# Patient Record
Sex: Female | Born: 1935
Health system: Southern US, Community
[De-identification: ages and names within clinical notes are randomized; demographics above are authoritative.]

## PROBLEM LIST (undated history)

## (undated) DIAGNOSIS — I83813 Varicose veins of bilateral lower extremities with pain: Secondary | ICD-10-CM

## (undated) DIAGNOSIS — C4491 Basal cell carcinoma of skin, unspecified: Secondary | ICD-10-CM

## (undated) DIAGNOSIS — E785 Hyperlipidemia, unspecified: Secondary | ICD-10-CM

## (undated) HISTORY — DX: Varicose veins of bilateral lower extremities with pain: I83.813

## (undated) HISTORY — DX: Hyperlipidemia, unspecified: E78.5

## (undated) HISTORY — PX: ABDOMINAL HYSTERECTOMY: SHX81

---
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
## 2017-03-03 ENCOUNTER — Telehealth: Payer: Self-pay | Admitting: Internal Medicine

## 2017-03-03 NOTE — Telephone Encounter (Signed)
Appointment scheduled.

## 2017-03-03 NOTE — Telephone Encounter (Signed)
Ok Thx 

## 2017-03-03 NOTE — Telephone Encounter (Signed)
Pt is requesting to establish care with you. I spoke with Jessica Rowland and she said that she spoke with you about seeing this pt. Would it be okay for me to set up an appointment for her to see you?

## 2017-03-13 ENCOUNTER — Ambulatory Visit (INDEPENDENT_AMBULATORY_CARE_PROVIDER_SITE_OTHER): Payer: Self-pay | Admitting: Internal Medicine

## 2017-03-13 ENCOUNTER — Encounter: Payer: Self-pay | Admitting: Internal Medicine

## 2017-03-13 DIAGNOSIS — G5701 Lesion of sciatic nerve, right lower limb: Secondary | ICD-10-CM

## 2017-03-13 MED ORDER — B COMPLEX PO TABS: 1.0000 | ORAL_TABLET | Freq: Every day | ORAL | 3 refills | Status: DC

## 2017-03-13 MED ORDER — VITAMIN D3 50 MCG (2000 UT) PO CAPS: 2000.0000 [IU] | ORAL_CAPSULE | Freq: Every day | ORAL | 3 refills | Status: AC

## 2017-03-13 MED ORDER — NAPROXEN SODIUM 220 MG PO TABS: 220.0000 mg | ORAL_TABLET | Freq: Every day | ORAL | 1 refills | Status: DC

## 2017-03-13 NOTE — Progress Notes (Signed)
   Subjective:  Patient ID: Jessica Rowland, female    DOB: 30-Oct-1931  Age: 83 y.o. MRN: [id number]  CC: No chief complaint on file.   HPI Jessica Rowland presents for R hip pain and pain down the leg x 2 years after she fell - worse now. Good ROM. Worse w/standing. Lives w/dtr  No outpatient prescriptions prior to visit.   No facility-administered medications prior to visit.     ROS Review of Systems  Constitutional: Negative for activity change, appetite change, chills, fatigue and unexpected weight change.  HENT: Negative for congestion, mouth sores and sinus pressure.   Eyes: Negative for visual disturbance.  Respiratory: Negative for cough and chest tightness.   Gastrointestinal: Negative for abdominal pain and nausea.  Genitourinary: Negative for difficulty urinating, frequency and vaginal pain.  Musculoskeletal: Positive for arthralgias and gait problem. Negative for back pain.  Skin: Negative for pallor and rash.  Neurological: Negative for dizziness, tremors, weakness, numbness and headaches.  Psychiatric/Behavioral: Negative for confusion and sleep disturbance.    Objective:  BP 116/62 (BP Location: Left Arm, Patient Position: Sitting, Cuff Size: Normal)   Pulse 80   Temp 98.1 F (36.7 C) (Oral)   Ht 5\' 2"  (1.575 m)   Wt 101 lb (45.8 kg)   SpO2 99%   BMI 18.47 kg/m   BP Readings from Last 3 Encounters:  03/13/17 116/62    Wt Readings from Last 3 Encounters:  03/13/17 101 lb (45.8 kg)    Physical Exam  Constitutional: She appears well-developed. No distress.  HENT:  Head: Normocephalic.  Right Ear: External ear normal.  Left Ear: External ear normal.  Nose: Nose normal.  Mouth/Throat: Oropharynx is clear and moist.  Eyes: Pupils are equal, round, and reactive to light. Conjunctivae are normal. Right eye exhibits no discharge. Left eye exhibits no discharge.  Neck: Normal range of motion. Neck supple. No JVD present. No tracheal deviation present. No  thyromegaly present.  Cardiovascular: Normal rate, regular rhythm and normal heart sounds.   Pulmonary/Chest: No stridor. No respiratory distress. She has no wheezes.  Abdominal: Soft. Bowel sounds are normal. She exhibits no distension and no mass. There is no tenderness. There is no rebound and no guarding.  Musculoskeletal: She exhibits tenderness. She exhibits no edema.  Lymphadenopathy:    She has no cervical adenopathy.  Neurological: She displays normal reflexes. No cranial nerve deficit. She exhibits normal muscle tone. Coordination normal.  Skin: No rash noted. No erythema.  Psychiatric: She has a normal mood and affect. Her behavior is normal. Judgment and thought content normal.  Str leg (-) B - very flexible R buttock and IT band is tender B hips w/full ROM, NT Thin   No results found for: WBC, HGB, HCT, PLT, GLUCOSE, CHOL, TRIG, HDL, LDLDIRECT, LDLCALC, ALT, AST, NA, K, CL, CREATININE, BUN, CO2, TSH, PSA, INR, GLUF, HGBA1C, MICROALBUR  Patient was never admitted.  Assessment & Plan:   There are no diagnoses linked to this encounter. Ms. Daley does not currently have medications on file.  No orders of the defined types were placed in this encounter.    Follow-up: No Follow-up on file.  Walker Kehr, MD

## 2017-03-13 NOTE — Patient Instructions (Signed)
Piriformis Syndrome Rehab Ask your health care provider which exercises are safe for you. Do exercises exactly as told by your health care provider and adjust them as directed. It is normal to feel mild stretching, pulling, tightness, or discomfort as you do these exercises, but you should stop right away if you feel sudden pain or your pain gets worse.Do not begin these exercises until told by your health care provider. Stretching and range of motion exercises These exercises warm up your muscles and joints and improve the movement and flexibility of your hip and pelvis. These exercises also help to relieve pain, numbness, and tingling. Exercise A: Hip rotators  1. Lie on your back on a firm surface. 2. Pull your left / right knee toward your same shoulder with your left / right hand until your knee is pointing toward the ceiling. Hold your left / right ankle with your other hand. 3. Keeping your knee steady, gently pull your left / right ankle toward your other shoulder until you feel a stretch in your buttocks. 4. Hold this position for __________ seconds. Repeat __________ times. Complete this stretch __________ times a day. Exercise B: Hip extensors 1. Lie on your back on a firm surface. Both of your legs should be straight. 2. Pull your left / right knee to your chest. Hold your leg in this position by holding onto the back of your thigh or the front of your knee. 3. Hold this position for __________ seconds. 4. Slowly return to the starting position. Repeat __________ times. Complete this stretch __________ times a day. Strengthening exercises These exercises build strength and endurance in your hip and thigh muscles. Endurance is the ability to use your muscles for a long time, even after they get tired. Exercise C: Straight leg raises ( hip abductors) 1. Lie on your side with your left / right leg in the top position. Lie so your head, shoulder, knee, and hip line up. Bend your bottom  knee to help you balance. 2. Lift your top leg up 4-6 inches (10-15 cm), keeping your toes pointed straight ahead. 3. Hold this position for __________ seconds. 4. Slowly lower your leg to the starting position. Let your muscles relax completely. Repeat __________ times. Complete this exercise__________ times a day. Exercise D: Hip abductors and rotators, quadruped  1. Get on your hands and knees on a firm, lightly padded surface. Your hands should be directly below your shoulders, and your knees should be directly below your hips. 2. Lift your left / right knee out to the side. Keep your knee bent. Do not twist your body. 3. Hold this position for __________ seconds. 4. Slowly lower your leg. Repeat __________ times. Complete this exercise__________ times a day. Exercise E: Straight leg raises ( hip extensors) 1. Lie on your abdomen on a bed or a firm surface with a pillow under your hips. 2. Squeeze your buttock muscles and lift your left / right thigh off the bed. Do not let your back arch. 3. Hold this position for __________ seconds. 4. Slowly return to the starting position. Let your muscles relax completely before doing another repetition. Repeat __________ times. Complete this exercise__________ times a day. This information is not intended to replace advice given to you by your health care provider. Make sure you discuss any questions you have with your health care provider. Document Released: 07/29/2005 Document Revised: 04/02/2016 Document Reviewed: 07/11/2015 Elsevier Interactive Patient Education  2018 [employer] Piriformis Syndrome Piriformis syndrome is a   condition that can cause pain and numbness in your buttocks and down the back of your leg. Piriformis syndrome happens when the small muscle that connects the base of your spine to your hip (piriformis muscle) presses on the nerve that runs down the back of your leg (sciatic nerve). The piriformis muscle helps your hip  rotate and helps to bring your leg back and out. It also helps shift your weight while you are walking to keep you stable. The sciatic nerve runs under or through the piriformis. Damage to the piriformis muscle can cause spasms that put pressure on the nerve below. This causes pain and discomfort while sitting and moving. The pain may feel as if it begins in the buttock and spreads (radiates) down your hip and thigh. What are the causes? This condition is caused by pressure on the sciatic nerve from the piriformis muscle. The piriformis muscle can get irritated with overuse, especially if other hip muscles are weak and the piriformis has to do extra work. Piriformis syndrome can also occur after an injury, like a fall onto your buttocks. What increases the risk? This condition is more likely to develop in:  Women.  People who sit for long periods of time.  Cyclists.  People who have weak buttocks muscles (gluteal muscles).  What are the signs or symptoms? Pain, tingling, or numbness that starts in the buttock and runs down the back of your leg (sciatica) is the most common symptom of this condition. Your symptoms may:  Get worse the longer you sit.  Get worse when you walk, run, or go up on stairs.  How is this diagnosed? This condition is diagnosed based on your symptoms, medical history, and physical exam. During this exam, your health care provider may move your leg into different positions to check for pain. He or she will also press on the muscles of your hip and buttock to see if that increases your symptoms. You may also have an X-ray or MRI. How is this treated? Treatment for this condition may include:  Stopping all activities that cause pain or make your condition worse.  Using heat or ice to relieve pain as told by your health care provider.  Taking medicines to reduce pain and swelling.  Taking a muscle relaxer to release the piriformis muscle.  Doing range-of-motion and  strengthening exercises (physical therapy) as told by your health care provider.  Massaging the affected area.  Getting an injection of an anti-inflammatory medicine or muscle relaxer to reduce inflammation and muscle tension.  In rare cases, you may need surgery to cut the muscle and release pressure on the nerve if other treatments do not work. Follow these instructions at home:  Take over-the-counter and prescription medicines only as told by your health care provider.  Do not sit for long periods. Get up and walk around every 20 minutes or as often as told by your health care provider.  If directed, apply heat to the affected area as often as told by your health care provider. Use the heat source that your health care provider recommends, such as a moist heat pack or a heating pad. ? Place a towel between your skin and the heat source. ? Leave the heat on for 20-30 minutes. ? Remove the heat if your skin turns bright red. This is especially important if you are unable to feel pain, heat, or cold. You may have a greater risk of getting burned.  If directed, apply ice to the   injured area. ? Put ice in a plastic bag. ? Place a towel between your skin and the bag. ? Leave the ice on for 20 minutes, 2-3 times a day.  Do exercises as told by your health care provider.  Return to your normal activities as told by your health care provider. Ask your health care provider what activities are safe for you.  Keep all follow-up visits as told by your health care provider. This is important. How is this prevented?  Do not sit for longer than 20 minutes at a time. When you sit, choose padded surfaces.  Warm up and stretch before being active.  Cool down and stretch after being active.  Give your body time to rest between periods of activity.  Make sure to use equipment that fits you.  Maintain physical fitness, including: ? Strength. ? Flexibility. Contact a health care provider  if:  Your pain and stiffness continue or get worse.  Your leg or hip becomes weak.  You have changes in your bowel function or bladder function. This information is not intended to replace advice given to you by your health care provider. Make sure you discuss any questions you have with your health care provider. Document Released: 07/29/2005 Document Revised: 04/02/2016 Document Reviewed: 07/11/2015 Elsevier Interactive Patient Education  2018 [employer]  

## 2017-04-03 ENCOUNTER — Telehealth: Payer: Self-pay | Admitting: Internal Medicine

## 2017-04-03 NOTE — Telephone Encounter (Signed)
Pt friend called asking that before the Pts appointment if Blood work and urinalysis and a chest xray can be ordered, she would like a chest xray because she has pain in her chest and pain when breathing. She would like this done so the results can be discussed at the appointment.  Please  Advise  The Pt now has Medicare

## 2017-04-03 NOTE — Telephone Encounter (Signed)
Come for an OV first Thx

## 2017-04-03 NOTE — Telephone Encounter (Signed)
Friend -Alfia informed of Plot response and will Inform Pt

## 2017-04-03 NOTE — Telephone Encounter (Signed)
Please advise 

## 2017-04-21 ENCOUNTER — Ambulatory Visit: Payer: Self-pay | Admitting: Internal Medicine

## 2017-05-15 ENCOUNTER — Other Ambulatory Visit (INDEPENDENT_AMBULATORY_CARE_PROVIDER_SITE_OTHER): Payer: Medicare Other

## 2017-05-15 ENCOUNTER — Encounter: Payer: Self-pay | Admitting: Internal Medicine

## 2017-05-15 ENCOUNTER — Ambulatory Visit (INDEPENDENT_AMBULATORY_CARE_PROVIDER_SITE_OTHER): Payer: Medicare Other | Admitting: Internal Medicine

## 2017-05-15 ENCOUNTER — Ambulatory Visit: Payer: Self-pay | Admitting: Internal Medicine

## 2017-05-15 VITALS — BP 138/64 | HR 70 | Temp 97.7°F | Ht 62.0 in | Wt 99.0 lb

## 2017-05-15 DIAGNOSIS — R42 Dizziness and giddiness: Secondary | ICD-10-CM | POA: Diagnosis not present

## 2017-05-15 DIAGNOSIS — R202 Paresthesia of skin: Secondary | ICD-10-CM

## 2017-05-15 DIAGNOSIS — Z23 Encounter for immunization: Secondary | ICD-10-CM

## 2017-05-15 DIAGNOSIS — C4490 Unspecified malignant neoplasm of skin, unspecified: Secondary | ICD-10-CM

## 2017-05-15 DIAGNOSIS — E559 Vitamin D deficiency, unspecified: Secondary | ICD-10-CM

## 2017-05-15 DIAGNOSIS — M5441 Lumbago with sciatica, right side: Secondary | ICD-10-CM | POA: Diagnosis not present

## 2017-05-15 DIAGNOSIS — C44300 Unspecified malignant neoplasm of skin of unspecified part of face: Secondary | ICD-10-CM | POA: Insufficient documentation

## 2017-05-15 DIAGNOSIS — G5701 Lesion of sciatic nerve, right lower limb: Secondary | ICD-10-CM | POA: Diagnosis not present

## 2017-05-15 DIAGNOSIS — H547 Unspecified visual loss: Secondary | ICD-10-CM | POA: Insufficient documentation

## 2017-05-15 LAB — BASIC METABOLIC PANEL
BUN: 8 mg/dL (ref 6–23)
CHLORIDE: 99 meq/L (ref 96–112)
CO2: 26 meq/L (ref 19–32)
Calcium: 9.3 mg/dL (ref 8.4–10.5)
Creatinine, Ser: 0.55 mg/dL (ref 0.40–1.20)
GFR: 112.25 mL/min (ref >60.00)
GLUCOSE: 99 mg/dL (ref 70–99)
POTASSIUM: 3.9 meq/L (ref 3.5–5.1)
SODIUM: 132 meq/L — AB (ref 135–145)

## 2017-05-15 LAB — URINALYSIS, ROUTINE W REFLEX MICROSCOPIC
Bilirubin Urine: NEGATIVE
HGB URINE DIPSTICK: NEGATIVE
Ketones, ur: NEGATIVE
NITRITE: NEGATIVE
RBC / HPF: NONE SEEN (ref 0–?)
Specific Gravity, Urine: <=1.005 — AB (ref 1.000–1.030)
TOTAL PROTEIN, URINE-UPE24: NEGATIVE
Urine Glucose: NEGATIVE
Urobilinogen, UA: 0.2 (ref 0.0–1.0)
pH: 7 (ref 5.0–8.0)

## 2017-05-15 LAB — CBC WITH DIFFERENTIAL/PLATELET
BASOS ABS: 0 10*3/uL (ref 0.0–0.1)
Basophils Relative: 0.6 % (ref 0.0–3.0)
EOS PCT: 0.4 % (ref 0.0–5.0)
Eosinophils Absolute: 0 10*3/uL (ref 0.0–0.7)
HEMATOCRIT: 41.2 % (ref 36.0–46.0)
Hemoglobin: 13.8 g/dL (ref 12.0–15.0)
LYMPHS ABS: 1.5 10*3/uL (ref 0.7–4.0)
LYMPHS PCT: 26.7 % (ref 12.0–46.0)
MCHC: 33.5 g/dL (ref 30.0–36.0)
MCV: 91.2 fl (ref 78.0–100.0)
MONOS PCT: 6.9 % (ref 3.0–12.0)
Monocytes Absolute: 0.4 10*3/uL (ref 0.1–1.0)
NEUTROS ABS: 3.8 10*3/uL (ref 1.4–7.7)
NEUTROS PCT: 65.4 % (ref 43.0–77.0)
PLATELETS: 300 10*3/uL (ref 150.0–400.0)
RBC: 4.51 Mil/uL (ref 3.87–5.11)
RDW: 13.7 % (ref 11.5–15.5)
WBC: 5.8 10*3/uL (ref 4.0–10.5)

## 2017-05-15 LAB — VITAMIN B12: Vitamin B-12: 1002 pg/mL — ABNORMAL HIGH (ref 211–911)

## 2017-05-15 LAB — HEPATIC FUNCTION PANEL
ALBUMIN: 4.2 g/dL (ref 3.5–5.2)
ALK PHOS: 47 U/L (ref 39–117)
ALT: 15 U/L (ref 0–35)
AST: 14 U/L (ref 0–37)
Bilirubin, Direct: 0.1 mg/dL (ref 0.0–0.3)
TOTAL PROTEIN: 7 g/dL (ref 6.0–8.3)
Total Bilirubin: 0.7 mg/dL (ref 0.2–1.2)

## 2017-05-15 LAB — TSH: TSH: 0.62 u[IU]/mL (ref 0.35–4.50)

## 2017-05-15 NOTE — Assessment & Plan Note (Signed)
ophth ref adviced

## 2017-05-15 NOTE — Assessment & Plan Note (Signed)
PT Aleve

## 2017-05-15 NOTE — Assessment & Plan Note (Signed)
[facility]

## 2017-05-15 NOTE — Assessment & Plan Note (Signed)
L cheek Skin surgery ref

## 2017-05-15 NOTE — Progress Notes (Signed)
Subjective:  Patient ID: Jessica Rowland, female    DOB: May 12, 1932  Age: 83 y.o. MRN: [id number]  CC: No chief complaint on file.   HPI Jessica Rowland presents for her R leg/buttock pain - 70% better C/o occ dizziness, eye site is down  Outpatient Medications Prior to Visit  Medication Sig Dispense Refill  . b complex vitamins tablet Take 1 tablet by mouth daily. 100 tablet 3  . Cholecalciferol (VITAMIN D3) 2000 units capsule Take 1 capsule (2,000 Units total) by mouth daily. 100 capsule 3  . Ginkgo Biloba 120 MG CAPS Take 120 mg by mouth 3 (three) times daily.    . naproxen sodium (ALEVE) 220 MG tablet Take 1 tablet (220 mg total) by mouth daily after breakfast. 30 tablet 1  . Omega-3 Fatty Acids (FISH OIL) 1000 MG CAPS Take 1,000 mg by mouth 2 (two) times daily.    . Vinpocetine POWD 30 mg by Does not apply route 3 (three) times daily.     No facility-administered medications prior to visit.     ROS Review of Systems  Constitutional: Negative for activity change, appetite change, chills, fatigue and unexpected weight change.  HENT: Negative for congestion, mouth sores and sinus pressure.   Eyes: Negative for visual disturbance.  Respiratory: Negative for cough and chest tightness.   Gastrointestinal: Negative for abdominal pain and nausea.  Genitourinary: Negative for difficulty urinating, frequency and vaginal pain.  Musculoskeletal: Positive for back pain and gait problem.  Skin: Negative for pallor and rash.  Neurological: Negative for dizziness, tremors, weakness, numbness and headaches.  Psychiatric/Behavioral: Negative for confusion and sleep disturbance.    Objective:  BP 138/64 (BP Location: Left Arm, Patient Position: Sitting, Cuff Size: Normal)   Pulse 70   Temp 97.7 F (36.5 C) (Oral)   Ht 5\' 2"  (1.575 m)   Wt 99 lb (44.9 kg)   SpO2 99%   BMI 18.11 kg/m   BP Readings from Last 3 Encounters:  05/15/17 138/64  03/13/17 116/62    Wt Readings from  Last 3 Encounters:  05/15/17 99 lb (44.9 kg)  03/13/17 101 lb (45.8 kg)    Physical Exam  Constitutional: She appears well-developed. No distress.  HENT:  Head: Normocephalic.  Right Ear: External ear normal.  Left Ear: External ear normal.  Nose: Nose normal.  Mouth/Throat: Oropharynx is clear and moist.  Eyes: Pupils are equal, round, and reactive to light. Conjunctivae are normal. Right eye exhibits no discharge. Left eye exhibits no discharge.  Neck: Normal range of motion. Neck supple. No JVD present. No tracheal deviation present. No thyromegaly present.  Cardiovascular: Normal rate, regular rhythm and normal heart sounds.   Pulmonary/Chest: No stridor. No respiratory distress. She has no wheezes.  Abdominal: Soft. Bowel sounds are normal. She exhibits no distension and no mass. There is no tenderness. There is no rebound and no guarding.  Musculoskeletal: She exhibits no edema or tenderness.  Lymphadenopathy:    She has no cervical adenopathy.  Neurological: She displays normal reflexes. No cranial nerve deficit. She exhibits normal muscle tone. Coordination abnormal.  Skin: No rash noted. No erythema.  Psychiatric: She has a normal mood and affect. Her behavior is normal. Judgment and thought content normal.  L cheek lesion, L jaw angle LN 1.0x0.5 cm  No results found for: WBC, HGB, HCT, PLT, GLUCOSE, CHOL, TRIG, HDL, LDLDIRECT, LDLCALC, ALT, AST, NA, K, CL, CREATININE, BUN, CO2, TSH, PSA, INR, GLUF, HGBA1C, MICROALBUR  Patient was never admitted.  Assessment & Plan:   There are no diagnoses linked to this encounter. I am having Ms. Gillingham maintain her Fish Oil, Ginkgo Biloba, Vinpocetine, Vitamin D3, b complex vitamins, naproxen sodium, and aspirin EC.  Meds ordered this encounter  Medications  . aspirin EC 81 MG tablet    Sig: Take 81 mg by mouth daily.     Follow-up: No Follow-up on file.  Walker Kehr, MD

## 2017-05-21 NOTE — Addendum Note (Signed)
Addended by: Karren Cobble on: 05/21/2017 04:18 PM   Modules accepted: Orders

## 2017-06-12 ENCOUNTER — Ambulatory Visit: Payer: Medicare Other | Attending: Internal Medicine | Admitting: Physical Therapy

## 2017-06-12 DIAGNOSIS — M25551 Pain in right hip: Secondary | ICD-10-CM | POA: Diagnosis not present

## 2017-06-12 DIAGNOSIS — M6281 Muscle weakness (generalized): Secondary | ICD-10-CM | POA: Diagnosis not present

## 2017-06-12 NOTE — Patient Instructions (Signed)
     Trigger Point Dry Needling  . What is Trigger Point Dry Needling (DN)? o DN is a physical therapy technique used to treat muscle pain and dysfunction. Specifically, DN helps deactivate muscle trigger points (muscle knots).  o A thin filiform needle is used to penetrate the skin and stimulate the underlying trigger point. The goal is for a local twitch response (LTR) to occur and for the trigger point to relax. No medication of any kind is injected during the procedure.   . What Does Trigger Point Dry Needling Feel Like?  o The procedure feels different for each individual patient. Some patients report that they do not actually feel the needle enter the skin and overall the process is not painful. Very mild bleeding may occur. However, many patients feel a deep cramping in the muscle in which the needle was inserted. This is the local twitch response.   . How Will I feel after the treatment? o Soreness is normal, and the onset of soreness may not occur for a few hours. Typically this soreness does not last longer than two days.  o Bruising is uncommon, however; ice can be used to decrease any possible bruising.  o In rare cases feeling tired or nauseous after the treatment is normal. In addition, your symptoms may get worse before they get better, this period will typically not last longer than 24 hours.   . What Can I do After My Treatment? o Increase your hydration by drinking more water for the next 24 hours. o You may place ice or heat on the areas treated that have become sore, however, do not use heat on inflamed or bruised areas. Heat often brings more relief post needling. o You can continue your regular activities, but vigorous activity is not recommended initially after the treatment for 24 hours. o DN is best combined with other physical therapy such as strengthening, stretching, and other therapies.    Thelda Gagan PT [facility] [address], [address] [place], [place] [address] Phone # [phone] Fax [phone] 

## 2017-06-12 NOTE — Therapy (Signed)
[facility] [address], [place], [place], [address] Phone: [phone]   Fax:  [phone]  Physical Therapy Evaluation  Patient Details  Name: Jessica Rowland MRN: [id number] Date of Birth: [birth year] Referring Provider: Dr. Alain Marion  Encounter Date: 06/12/2017      PT End of Session - 06/12/17 1520    Visit Number 1   Date for PT Re-Evaluation 08/07/17   PT Start Time 1440   PT Stop Time 1525   PT Time Calculation (min) 45 min   Activity Tolerance Patient tolerated treatment well      Past Medical History:  Diagnosis Date  . Hyperlipidemia     Past Surgical History:  Procedure Laterality Date  . ABDOMINAL HYSTERECTOMY      There were no vitals filed for this visit.       Subjective Assessment - 06/12/17 1445    Subjective Long time history 10 years ago;  of right hip to pain to bottom of foot;  numbness in foot;  constant symptoms;  exercises improved pain but numbness same.  PCP gave her stretches.     Patient is accompained by: Interpreter   Pertinent History hard of hearing    Limitations House hold activities;Walking;Standing;Sitting   How long can you sit comfortably? 5-10 min    How long can you walk comfortably? 15 min   Diagnostic tests no x-rays or scans   Patient Stated Goals she hopes she will not have pain and reduce numbness   Currently in Pain? Yes   Pain Score 5    Pain Location Hip   Pain Orientation Right   Pain Type Chronic pain   Pain Radiating Towards right LE to foot   Pain Onset More than a month ago   Pain Frequency Constant   Aggravating Factors  sitting; bending over, walking   Pain Relieving Factors piriformis stretching; heat;   massage with ball            [facility] PT Assessment - 06/12/17 0001      Assessment   Medical Diagnosis right piriformis syndrome   Referring Provider Dr. Alain Marion   Onset Date/Surgical Date --  10 years   Next MD Visit Jan 2019    Prior Therapy no     Precautions   Precautions None     Restrictions   Weight Bearing Restrictions No     Balance Screen   Has the patient fallen in the past 6 months No   Has the patient had a decrease in activity level because of a fear of falling?  No   Is the patient reluctant to leave their home because of a fear of falling?  No     Home Environment   Living Environment Private residence   Living Arrangements Alone   Available Help at Discharge Family   Type of [place] to enter   Entrance Stairs-Number of Steps 12  difficult to go up stairs   Home Layout One level   Home Equipment None     Prior Function   Level of Independence Independent with basic ADLs   Vocation Retired   Leisure reading     Observation/Other Assessments   Focus on Therapeutic Outcomes (FOTO)  non English speaking     Sensation   Light Touch --  decreased sensation to light touch L5/S1 dermatome     Posture/Postural Control   Posture Comments decreased lumbar lordosis, increased thoracic kyphosis  AROM   AROM Assessment Site --  lumbar and hip ROM WFLs;  pain with hip internal rotation   Right/Left Hip Right;Left     Strength   Strength Assessment Site --  Right ankle DF, PF, Inversion and eversion 4-/5   Right/Left Hip Right;Left   Right Hip Flexion 4-/5   Right Hip Extension 4-/5   Right Hip External Rotation  4-/5   Right Hip ABduction 3+/5   Lumbar Flexion --  decreased activation of transverse abdominus     Flexibility   Soft Tissue Assessment /Muscle Length --  able to touch toes in long sitting position     Palpation   Palpation comment tender points in right piriformis      Special Tests    Special Tests --  SLS on right 3 sec with UE support     Slump test   Findings Positive   Side Right     Straight Leg Raise   Findings Positive   Side  Right            Objective measurements completed on examination: See above  findings.          [facility] Adult PT Treatment/Exercise - 06/12/17 0001      Moist Heat Therapy   Number Minutes Moist Heat 5 Minutes   Moist Heat Location Hip     Manual Therapy   Soft tissue mobilization right piriformis          Trigger Point Dry Needling - 06/12/17 1653    Consent Given? Yes   Education Handout Provided Yes   Muscles Treated Lower Body Piriformis   Piriformis Response Palpable increased muscle length              PT Education - 06/12/17 1653    Education provided Yes   Education Details dry needling info   Person(s) Educated Patient   Methods Explanation;Handout   Comprehension Verbalized understanding          PT Short Term Goals - 06/12/17 1704      PT SHORT TERM GOAL #1   Title The patient will demonstrate knowledge of basic self care strategies for pain reduction   Time 4   Period Weeks   Status New   Target Date 07/10/17     PT SHORT TERM GOAL #2   Title The patient will report a 30% improvement in pain with sitting, walking and standing   Time 4   Period Weeks   Status New     PT SHORT TERM GOAL #3   Title The patient will be able to sit 10-12 minutes with minimal increase in pain   Time 4   Period Weeks   Status New           PT Long Term Goals - 06/12/17 1706      PT LONG TERM GOAL #1   Title The patient will be independent in safe self progression of HEP   Time 8   Period Weeks   Status New   Target Date 08/07/17     PT LONG TERM GOAL #2   Title The patient will report a 60% improvement in pain with sitting, standing and walking   Time 8   Period Weeks   Status New     PT LONG TERM GOAL #3   Title The patient will have improved right LE strength grossly 4/5 needed for standing, walking and ascending/descending steps to her apartment   Time 8  Period Weeks   Status New     PT LONG TERM GOAL #4   Title The patient will be able to sit for 15 minutes with minimal increase in pain   Time 8   Period  Weeks   Status New                Plan - 06/12/17 1654    Clinical Impression Statement Long time history 10 years ago.  The pain was initially in the  right buttock but has progressed  to radiating pain to bottom of her foot.  More recently she now has numbness in foot and lack of sensation on a constant basis.   She has started exercises to stretch which have improved pain but the numbness is the same.   Tender points in right piriformis.  Pain with right SLR and slump tests.  Pain with right hip and knee ROM.  Decreased strength in right foot, knee and hip musculature and pain with resistance, grossly 4-/5 except hip abduction 3+/5.  Pain with hip internal rotation but better with external rotation and long axis distraction.  Normal HS length.  She would benefit from PT to address these deficits.     History and Personal Factors relevant to plan of care: non-English speaking; advanced age;  lack of home support;  worsening of symptoms    Clinical Presentation Evolving   Clinical Presentation due to: increased numbness and peripheralization   Clinical Decision Making Moderate   Rehab Potential Good   PT Frequency 2x / week   PT Duration 8 weeks   PT Treatment/Interventions ADLs/Self Care Home Management;Electrical Stimulation;Cryotherapy;Iontophoresis 4mg /ml Dexamethasone;Moist Heat;Ultrasound;Patient/family education;Neuromuscular re-education;Therapeutic exercise;Therapeutic activities;Manual techniques;Taping;Dry needling   PT Next Visit Plan assess response to dry needling;  add iontophoresis if certification signed;  other pain relieving modalities;  manual techniques;  long axis distraction; gluteus medius strengthening     By signing I understand that I am ordering/authorizing the use of Iontophoresis using 4 mg/mL of dexamethasone as a component of this plan of care.  Patient will benefit from skilled therapeutic intervention in order to improve the following deficits and  impairments:  Pain, Decreased activity tolerance, Decreased strength, Difficulty walking, Increased muscle spasms, Impaired sensation  Visit Diagnosis: Pain in right hip - Plan: PT plan of care cert/re-cert  Muscle weakness (generalized) - Plan: PT plan of care cert/re-cert      G-Codes - 05/28/50 1708    Functional Assessment Tool Used (Outpatient Only) Clinical judgement   Functional Limitation Mobility: Walking and moving around   Mobility: Walking and Moving Around Current Status ([id number]) At least 40 percent but less than 60 percent impaired, limited or restricted   Mobility: Walking and Moving Around Goal Status [phone]) At least 20 percent but less than 40 percent impaired, limited or restricted       Problem List Patient Active Problem List   Diagnosis Date Noted  . Skin cancer of face 05/15/2017  . Dizzinesses 05/15/2017  . Vision problems 05/15/2017  . Piriformis syndrome of right side 03/13/2017   Ruben Im, PT 06/12/17 5:11 PM Phone: [phone] Fax: [phone]  Alvera Singh 06/12/2017, 5:10 PM  [facility] [address] [place], [place], [address] Phone: [phone]   Fax:  [phone]  Name: Arelys Glassco MRN: [id number] Date of Birth: 30-May-1932

## 2017-06-17 ENCOUNTER — Ambulatory Visit: Payer: Medicare Other | Admitting: Physical Therapy

## 2017-06-17 DIAGNOSIS — M6281 Muscle weakness (generalized): Secondary | ICD-10-CM | POA: Diagnosis not present

## 2017-06-17 DIAGNOSIS — M25551 Pain in right hip: Secondary | ICD-10-CM | POA: Diagnosis not present

## 2017-06-17 NOTE — Therapy (Signed)
[facility] [address] [place], [place], [address] Phone: [phone]   Fax:  [phone]  Physical Therapy Treatment  Patient Details  Name: Jessica Rowland MRN: [id number] Date of Birth: Dec 18, 1931 Referring Provider: Dr. Alain Marion   Encounter Date: 06/17/2017  PT End of Session - 06/17/17 1316    Visit Number  2    Number of Visits  10    Date for PT Re-Evaluation  08/07/17    Authorization Type  G code at visit 10;  KX at visit 15    PT Start Time  1145    PT Stop Time  1235    PT Time Calculation (min)  50 min    Activity Tolerance  Patient tolerated treatment well       Past Medical History:  Diagnosis Date  . Hyperlipidemia     Past Surgical History:  Procedure Laterality Date  . ABDOMINAL HYSTERECTOMY      There were no vitals filed for this visit.  Subjective Assessment - 06/17/17 1147    Subjective  Reports she is good.  States she had only a little soreness that day following the dry needling.   Numbness continues to be constant.      Patient is accompained by:  Interpreter "Inna" for language resources   "Inna" for language resources   Pertinent History  hard of hearing ;  speaks [place]    Limitations  House hold activities;Walking;Standing;Sitting    Currently in Pain?  Yes    Pain Score  5     Pain Location  Hip    Pain Orientation  Right    Pain Radiating Towards  right LE to foot    Aggravating Factors   lying on that side                      [facility] Adult PT Treatment/Exercise - 06/17/17 0001      Neuro Re-ed    Neuro Re-ed Details   gluteal activation especially medius;  decompression and elongation       Knee/Hip Exercises: Supine   Other Supine Knee/Hip Exercises  leg lengthener 5x    Other Supine Knee/Hip Exercises  whole leg press down 5x      Knee/Hip Exercises: Sidelying   Clams  10x with mild manual resistance      Moist Heat Therapy   Number  Minutes Moist Heat  15 Minutes    Moist Heat Location  Hip      Electrical Stimulation   Electrical Stimulation Location  right hip, lateral thigh and lateral leg    Electrical Stimulation Action  Pre-mod    Electrical Stimulation Parameters  9 ma 15 min    Electrical Stimulation Goals  Pain      Manual Therapy   Manual Therapy  Joint mobilization    Joint Mobilization  long leg distraction 5x 20 sec grade 3    Soft tissue mobilization  right piriformis and gluteals       Trigger Point Dry Needling - 06/17/17 1316    Consent Given?  Yes    Muscles Treated Lower Body  Gluteus minimus;Gluteus maximus;Piriformis    Gluteus Maximus Response  Palpable increased muscle length    Gluteus Minimus Response  Palpable increased muscle length    Piriformis Response  Palpable increased muscle length       Right only      PT Short Term Goals - 06/12/17 1704  PT SHORT TERM GOAL #1   Title  The patient will demonstrate knowledge of basic self care strategies for pain reduction    Time  4    Period  Weeks    Status  New    Target Date  07/10/17      PT SHORT TERM GOAL #2   Title  The patient will report a 30% improvement in pain with sitting, walking and standing    Time  4    Period  Weeks    Status  New      PT SHORT TERM GOAL #3   Title  The patient will be able to sit 10-12 minutes with minimal increase in pain    Time  4    Period  Weeks    Status  New        PT Long Term Goals - 06/12/17 1706      PT LONG TERM GOAL #1   Title  The patient will be independent in safe self progression of HEP    Time  8    Period  Weeks    Status  New    Target Date  08/07/17      PT LONG TERM GOAL #2   Title  The patient will report a 60% improvement in pain with sitting, standing and walking    Time  8    Period  Weeks    Status  New      PT LONG TERM GOAL #3   Title  The patient will have improved right LE strength grossly 4/5 needed for standing, walking and  ascending/descending steps to her apartment    Time  8    Period  Weeks    Status  New      PT LONG TERM GOAL #4   Title  The patient will be able to sit for 15 minutes with minimal increase in pain    Time  8    Period  Weeks    Status  New            Plan - 06/17/17 1318    Clinical Impression Statement  The patient reports her right buttock is feeling better but the right LE numbness continues to be constant.  The patient has decreased piriformis tender points compared to last session but several tender points noted in gluteals.  Mild discomfort with gluteal strengthening exercises but relief with elongation/stretching.      Rehab Potential  Good    PT Frequency  2x / week    PT Duration  8 weeks    PT Treatment/Interventions  ADLs/Self Care Home Management;Electrical Stimulation;Cryotherapy;Iontophoresis 4mg /ml Dexamethasone;Moist Heat;Ultrasound;Patient/family education;Neuromuscular re-education;Therapeutic exercise;Therapeutic activities;Manual techniques;Taping;Dry needling    PT Next Visit Plan  assess response to dry needling;  add iontophoresis if certification signed;  assess response to electrical stimulation;  manual techniques;  long axis distraction; gluteus medius strengthening    Recommended Other Services  certification rerouted 11/6       Patient will benefit from skilled therapeutic intervention in order to improve the following deficits and impairments:  Pain, Decreased activity tolerance, Decreased strength, Difficulty walking, Increased muscle spasms, Impaired sensation  Visit Diagnosis: Pain in right hip  Muscle weakness (generalized)     Problem List Patient Active Problem List   Diagnosis Date Noted  . Skin cancer of face 05/15/2017  . Dizzinesses 05/15/2017  . Vision problems 05/15/2017  . Piriformis syndrome of right side 03/13/2017   Ruben Im,  PT 06/17/17 1:25 PM Phone: [phone] Fax: [phone]  Alvera Singh 06/17/2017,  1:24 PM  [facility] [address] [place], [place], [address] Phone: [phone]   Fax:  [phone]  Name: Jessica Rowland MRN: [id number] Date of Birth: 08/09/1932

## 2017-06-18 ENCOUNTER — Other Ambulatory Visit: Payer: Self-pay

## 2017-06-18 DIAGNOSIS — H547 Unspecified visual loss: Secondary | ICD-10-CM

## 2017-06-19 ENCOUNTER — Ambulatory Visit: Payer: Medicare Other | Admitting: Physical Therapy

## 2017-06-19 DIAGNOSIS — M25551 Pain in right hip: Secondary | ICD-10-CM

## 2017-06-19 DIAGNOSIS — M6281 Muscle weakness (generalized): Secondary | ICD-10-CM

## 2017-06-19 NOTE — Patient Instructions (Signed)
   SIDELYING CLAMSHELL  While lying on your side with your knees bent, draw up the top knee while keeping contact of your feet together.  Do not let your pelvis roll back during the lifting movement. 2 sets of 10   BRIDGING  While lying on your back with knees bent, tighten your lower abdominals, squeeze your buttocks and then raise your buttocks off the floor/bed as creating a "Bridge" with your body. Hold and then lower yourself and repeat. 2 sets of 10

## 2017-06-19 NOTE — Therapy (Signed)
[facility] [address], [place], [place], [address] Phone: [phone]   Fax:  [phone]  Physical Therapy Treatment  Patient Details  Name: Jessica Rowland MRN: [id number] Date of Birth: [birth year] Referring Provider: Dr. Alain Marion   Encounter Date: 06/19/2017  PT End of Session - 06/19/17 1315    Visit Number  3    Number of Visits  10    Date for PT Re-Evaluation  08/07/17    Authorization Type  G code at visit 10;  KX at visit 15    PT Start Time  1232    PT Stop Time  1315    PT Time Calculation (min)  43 min    Activity Tolerance  Patient tolerated treatment well       Past Medical History:  Diagnosis Date  . Hyperlipidemia     Past Surgical History:  Procedure Laterality Date  . ABDOMINAL HYSTERECTOMY      There were no vitals filed for this visit.  Subjective Assessment - 06/19/17 1236    Subjective  I had a new pain that I think was from the electricity last time.  It was the worst at night.    Patient is accompained by:  Interpreter    Pertinent History  hard of hearing ;  speaks [place]    Patient Stated Goals  she hopes she will not have pain and reduce numbness    Currently in Pain?  Yes    Pain Score  6     Pain Location  Hip    Pain Orientation  Right    Pain Descriptors / Indicators  Sharp;Stabbing;Throbbing    Pain Type  Chronic pain    Pain Radiating Towards  mid thight    Pain Onset  More than a month ago    Pain Frequency  Constant    Aggravating Factors   lying down                      [facility] Adult PT Treatment/Exercise - 06/19/17 0001      Neuro Re-ed    Neuro Re-ed Details   gluteal activation especially medius;  decompression and elongation       Knee/Hip Exercises: Supine   Bridges  Strengthening;10 reps;Both    Straight Leg Raises  Strengthening;Right;Left    Other Supine Knee/Hip Exercises  clam with red band - single leg - 10x each side    Other  Supine Knee/Hip Exercises  whole leg press down and leg lengthener - 5x each      Manual Therapy   Manual Therapy  Soft tissue mobilization;Muscle Energy Technique    Soft tissue mobilization  right glutes and TFL    Muscle Energy Technique  posterior rotation of right pelvis             PT Education - 06/19/17 1315    Education provided  Yes    Education Details  bridge and clam    Person(s) Educated  Patient;Other (comment) interpreter    Methods  Explanation;Demonstration;Verbal cues;Handout    Comprehension  Verbalized understanding;Returned demonstration       PT Short Term Goals - 06/19/17 1322      PT SHORT TERM GOAL #1   Title  The patient will demonstrate knowledge of basic self care strategies for pain reduction    Time  4    Period  Weeks    Status  On-going  PT SHORT TERM GOAL #2   Title  The patient will report a 30% improvement in pain with sitting, walking and standing    Time  4    Period  Weeks    Status  On-going      PT SHORT TERM GOAL #3   Title  The patient will be able to sit 10-12 minutes with minimal increase in pain    Time  4    Period  Weeks    Status  On-going        PT Long Term Goals - 06/12/17 1706      PT LONG TERM GOAL #1   Title  The patient will be independent in safe self progression of HEP    Time  8    Period  Weeks    Status  New    Target Date  08/07/17      PT LONG TERM GOAL #2   Title  The patient will report a 60% improvement in pain with sitting, standing and walking    Time  8    Period  Weeks    Status  New      PT LONG TERM GOAL #3   Title  The patient will have improved right LE strength grossly 4/5 needed for standing, walking and ascending/descending steps to her apartment    Time  8    Period  Weeks    Status  New      PT LONG TERM GOAL #4   Title  The patient will be able to sit for 15 minutes with minimal increase in pain    Time  8    Period  Weeks    Status  New            Plan  - 06/19/17 1315    Clinical Impression Statement  Patient did well with exercises and no increased pain.  She reported increased numbness into right leg during MET but then had less numbness after and had improvement of pelvic obliquity with right hip less anterior rotation.  Pt was fatigued with clam but able to perform correctly.  She had a lot of tenderness with palpation around greater trochanter in the gluteal bursea region.  Pt continues to benefit from skilled PT to improve functional movement.      PT Treatment/Interventions  ADLs/Self Care Home Management;Electrical Stimulation;Cryotherapy;Iontophoresis 7m/ml Dexamethasone;Moist Heat;Ultrasound;Patient/family education;Neuromuscular re-education;Therapeutic exercise;Therapeutic activities;Manual techniques;Taping;Dry needling    PT Next Visit Plan  f/u on clam and bridge, dry needle right glutes, ionto if cert signed; check pelvic alignemnt, glute and core strengthening    Consulted and Agree with Plan of Care  Patient       Patient will benefit from skilled therapeutic intervention in order to improve the following deficits and impairments:  Pain, Decreased activity tolerance, Decreased strength, Difficulty walking, Increased muscle spasms, Impaired sensation  Visit Diagnosis: Pain in right hip  Muscle weakness (generalized)     Problem List Patient Active Problem List   Diagnosis Date Noted  . Skin cancer of face 05/15/2017  . Dizzinesses 05/15/2017  . Vision problems 05/15/2017  . Piriformis syndrome of right side 03/13/2017    JZannie Cove PT 06/19/2017, 1:24 PM  [facility] [address] [place] [address]Phone: [phone]  Fax:  [phone] Name: GKaaren NassMRN: [id number] of Birth: 130-Dec-1933

## 2017-06-24 ENCOUNTER — Ambulatory Visit: Payer: Medicare Other | Admitting: Physical Therapy

## 2017-06-24 DIAGNOSIS — M25551 Pain in right hip: Secondary | ICD-10-CM

## 2017-06-24 DIAGNOSIS — M6281 Muscle weakness (generalized): Secondary | ICD-10-CM

## 2017-06-24 NOTE — Therapy (Signed)
[facility] [address] [place], [place], [address] Phone: [phone]   Fax:  [phone]  Physical Therapy Treatment  Patient Details  Name: Jessica Rowland MRN: [id number] Date of Birth: 10/14/1931 Referring Provider: Dr. Alain Marion   Encounter Date: 06/24/2017  PT End of Session - 06/24/17 1023    Visit Number  3    Number of Visits  10    Date for PT Re-Evaluation  08/07/17    Authorization Type  G code at visit 10;  KX at visit 15    PT Start Time  1018    PT Stop Time  1100    PT Time Calculation (min)  42 min    Activity Tolerance  Patient tolerated treatment well       Past Medical History:  Diagnosis Date  . Hyperlipidemia     Past Surgical History:  Procedure Laterality Date  . ABDOMINAL HYSTERECTOMY      There were no vitals filed for this visit.  Subjective Assessment - 06/24/17 1019    Subjective  Pt reports that things are going fair. She has no pain currently, just the numbness down the side of the Rt leg down to her toes.     Patient is accompained by:  Interpreter    Pertinent History  hard of hearing ;  speaks [place]    Patient Stated Goals  she hopes she will not have pain and reduce numbness    Currently in Pain?  No/denies    Pain Onset  More than a month ago                      [facility] Adult PT Treatment/Exercise - 06/24/17 0001      Exercises   Exercises  Knee/Hip      Knee/Hip Exercises: Stretches   Passive Hamstring Stretch  2 reps;30 seconds    Passive Hamstring Stretch Limitations  long sitting, therapist providing instruction of proper mechanics and avoiding excessive trunk flexion with home rountine       Knee/Hip Exercises: Supine   Bridges  Both;1 set;15 reps    Other Supine Knee/Hip Exercises  single leg clam x15 reps, with abdominal bracing     Other Supine Knee/Hip Exercises  hooklying low trunk rotation x10 reps each direction; abdominal  bracing with bent knee raise x15 reps       Manual Therapy   Joint Mobilization  long leg distraction 5x 20 sec grade 3    Soft tissue mobilization  Rt TFL, glutes, lateral quadriceps             PT Education - 06/24/17 1021    Education provided  Yes    Education Details  educated pt on benefits of exercise and stretching to improve pain, but reminded pt on the differences between nerve healing and muscle recovery; reviewed pt's "favorite stretches" from her home routine and made adjustments to decrease strain on lumbar spine.     Person(s) Educated  Patient    Methods  Explanation;Verbal cues;Tactile cues via language intertrepter service    Comprehension  Verbalized understanding       PT Short Term Goals - 06/19/17 1322      PT SHORT TERM GOAL #1   Title  The patient will demonstrate knowledge of basic self care strategies for pain reduction    Time  4    Period  Weeks    Status  On-going  PT SHORT TERM GOAL #2   Title  The patient will report a 30% improvement in pain with sitting, walking and standing    Time  4    Period  Weeks    Status  On-going      PT SHORT TERM GOAL #3   Title  The patient will be able to sit 10-12 minutes with minimal increase in pain    Time  4    Period  Weeks    Status  On-going        PT Long Term Goals - 06/12/17 1706      PT LONG TERM GOAL #1   Title  The patient will be independent in safe self progression of HEP    Time  8    Period  Weeks    Status  New    Target Date  08/07/17      PT LONG TERM GOAL #2   Title  The patient will report a 60% improvement in pain with sitting, standing and walking    Time  8    Period  Weeks    Status  New      PT LONG TERM GOAL #3   Title  The patient will have improved right LE strength grossly 4/5 needed for standing, walking and ascending/descending steps to her apartment    Time  8    Period  Weeks    Status  New      PT LONG TERM GOAL #4   Title  The patient will be  able to sit for 15 minutes with minimal increase in pain    Time  8    Period  Weeks    Status  New            Plan - 06/24/17 1218    Clinical Impression Statement  Pt arrived with continued reports of numbness along the Rt lateral thigh and down to the last 4 toes. She appears to be demonstrating improvements in pain, reporting no pain currently or during her session. Completed therex and manual techniques to improve core strength. Pt demonstrated several exercises from her gymnastics stretching routine that are likely placing excess strain on her low back. Therapist was able to make adjustments to pt's form and pt demonstrated good understanding. Noted significant muscle restrictions throughout the gluteal and lateral quadriceps region, pt could benefit from another dry needling treatment next session to address this.     PT Treatment/Interventions  ADLs/Self Care Home Management;Electrical Stimulation;Cryotherapy;Iontophoresis 4mg /ml Dexamethasone;Moist Heat;Ultrasound;Patient/family education;Neuromuscular re-education;Therapeutic exercise;Therapeutic activities;Manual techniques;Taping;Dry needling    PT Next Visit Plan  f/u on clam and bridge, dry needle right glutes, ionto if cert signed; check pelvic alignemnt, glute and core strengthening    Consulted and Agree with Plan of Care  Patient       Patient will benefit from skilled therapeutic intervention in order to improve the following deficits and impairments:  Pain, Decreased activity tolerance, Decreased strength, Difficulty walking, Increased muscle spasms, Impaired sensation  Visit Diagnosis: Pain in right hip  Muscle weakness (generalized)     Problem List Patient Active Problem List   Diagnosis Date Noted  . Skin cancer of face 05/15/2017  . Dizzinesses 05/15/2017  . Vision problems 05/15/2017  . Piriformis syndrome of right side 03/13/2017    1:23 PM,06/24/17 Elly Modena PT, DPT [facility] [address] [place],  [place], [address] Phone: [phone]   Fax:  [phone]  Name: Jessica Rowland MRN: [id number] Date of Birth: 03/27/1932

## 2017-06-26 ENCOUNTER — Ambulatory Visit: Payer: Medicare Other | Admitting: Physical Therapy

## 2017-06-26 DIAGNOSIS — M25551 Pain in right hip: Secondary | ICD-10-CM | POA: Diagnosis not present

## 2017-06-26 DIAGNOSIS — M6281 Muscle weakness (generalized): Secondary | ICD-10-CM | POA: Diagnosis not present

## 2017-06-26 NOTE — Therapy (Signed)
[facility] [address] [place], [place], [address] Phone: [phone]   Fax:  [phone]  Physical Therapy Treatment  Patient Details  Name: Jessica Rowland MRN: [id number] Date of Birth: [birth year] Referring Provider: Dr. Alain Marion   Encounter Date: 06/26/2017  PT End of Session - 06/26/17 1215    Visit Number  5    Number of Visits  10    Date for PT Re-Evaluation  08/07/17    Authorization Type  G code at visit 10;  KX at visit 15    PT Start Time  1105    PT Stop Time  1146    PT Time Calculation (min)  41 min    Activity Tolerance  Patient tolerated treatment well       Past Medical History:  Diagnosis Date  . Hyperlipidemia     Past Surgical History:  Procedure Laterality Date  . ABDOMINAL HYSTERECTOMY      There were no vitals filed for this visit.  Subjective Assessment - 06/26/17 1112    Subjective  Patient states her leg is numb today.  Denies pain.  She states she cannot sleep on the Rt side    Patient is accompained by:  Interpreter    Pertinent History  hard of hearing ;  speaks [place]    Limitations  House hold activities;Walking;Standing;Sitting    Patient Stated Goals  she hopes she will not have pain and reduce numbness    Currently in Pain?  No/denies         [facility] PT Assessment - 06/26/17 0001      AROM   AROM Assessment Site  Other (comment)    Right/Left Hip  Right pain with abd and ER Rt hip      Strength   Right Hip ABduction  4-/5    Right Hip ADduction  3+/5                  [facility] Adult PT Treatment/Exercise - 06/26/17 0001      Knee/Hip Exercises: Standing   Hip Abduction  Stengthening;Right;10 reps;Knee straight yellow band - left LE only      Knee/Hip Exercises: Supine   Other Supine Knee/Hip Exercises  ball squeeze for adduction      Manual Therapy   Joint Mobilization  long leg distraction 5x 20 sec grade 3    Soft tissue mobilization  Rt  TFL, glutes       Trigger Point Dry Needling - 06/26/17 1149    Consent Given?  Yes    Muscles Treated Lower Body  Gluteus minimus;Gluteus maximus;Piriformis;Tensor fascia lata    Gluteus Maximus Response  Twitch response elicited;Palpable increased muscle length    Gluteus Minimus Response  Twitch response elicited;Palpable increased muscle length    Piriformis Response  Twitch response elicited;Palpable increased muscle length    Tensor Fascia Lata Response  Twitch response elicited;Palpable increased muscle length             PT Short Term Goals - 06/26/17 1222      PT SHORT TERM GOAL #1   Title  The patient will demonstrate knowledge of basic self care strategies for pain reduction    Time  4    Period  Weeks    Status  Achieved      PT SHORT TERM GOAL #2   Title  The patient will report a 30% improvement in pain with sitting, walking and standing  Time  4    Period  Weeks    Status  On-going      PT SHORT TERM GOAL #3   Title  The patient will be able to sit 10-12 minutes with minimal increase in pain    Time  4    Period  Weeks    Status  On-going        PT Long Term Goals - 06/12/17 1706      PT LONG TERM GOAL #1   Title  The patient will be independent in safe self progression of HEP    Time  8    Period  Weeks    Status  New    Target Date  08/07/17      PT LONG TERM GOAL #2   Title  The patient will report a 60% improvement in pain with sitting, standing and walking    Time  8    Period  Weeks    Status  New      PT LONG TERM GOAL #3   Title  The patient will have improved right LE strength grossly 4/5 needed for standing, walking and ascending/descending steps to her apartment    Time  8    Period  Weeks    Status  New      PT LONG TERM GOAL #4   Title  The patient will be able to sit for 15 minutes with minimal increase in pain    Time  8    Period  Weeks    Status  New            Plan - 06/26/17 1216    Clinical Impression  Statement  Pt reported she continues to have numbness down the lateral right leg into the dorsal side of foot digits 2-5.  Pt has been doing exercises at home and feels like it is helping a little.  She was able to demonstrate improved hip abduction strength.  She continues to have decreased hip IR ROM and right hip ER is painful.  Her hip adduction tested weak at 3+/5.  Pt expressed interest in trying dry needling again even though she is not sure if it helped yet, but feels it is too early to tell.  Pt had good response to dry needling and reported no numbness down the side of the leg, but still had numbness in the toes after treatment.  Pt was educated and performed hip adduction in standing.  She continues to need skilled PT for hip strength and ROM for improved functional activities such as walking without increased symptoms.    Rehab Potential  Good    PT Treatment/Interventions  ADLs/Self Care Home Management;Electrical Stimulation;Cryotherapy;Iontophoresis 4mg /ml Dexamethasone;Moist Heat;Ultrasound;Patient/family education;Neuromuscular re-education;Therapeutic exercise;Therapeutic activities;Manual techniques;Taping;Dry needling    PT Next Visit Plan  f/u on clam and bridge, dry needle right glutes, ionto if cert signed; check pelvic alignemnt, glute and core strengthening    PT Home Exercise Plan  hip adduction in standing with band or lying down if tolerated    Consulted and Agree with Plan of Care  Patient       Patient will benefit from skilled therapeutic intervention in order to improve the following deficits and impairments:  Pain, Decreased activity tolerance, Decreased strength, Difficulty walking, Increased muscle spasms, Impaired sensation  Visit Diagnosis: Pain in right hip  Muscle weakness (generalized)     Problem List Patient Active Problem List   Diagnosis Date Noted  . Skin cancer  of face 05/15/2017  . Dizzinesses 05/15/2017  . Vision problems 05/15/2017  .  Piriformis syndrome of right side 03/13/2017    Zannie Cove, PT 06/26/2017, 12:27 PM  [facility] [address], [place], [place], [address] Phone: [phone]   Fax:  [phone]  Name: Jessica Rowland MRN: [id number] Date of Birth: [birth year]

## 2017-06-27 DIAGNOSIS — J029 Acute pharyngitis, unspecified: Secondary | ICD-10-CM | POA: Diagnosis not present

## 2017-07-01 ENCOUNTER — Ambulatory Visit: Payer: Medicare Other | Admitting: Physical Therapy

## 2017-07-01 DIAGNOSIS — M6281 Muscle weakness (generalized): Secondary | ICD-10-CM | POA: Diagnosis not present

## 2017-07-01 DIAGNOSIS — M25551 Pain in right hip: Secondary | ICD-10-CM | POA: Diagnosis not present

## 2017-07-01 NOTE — Therapy (Signed)
[facility] [address], [place], [place], [address] Phone: [phone]   Fax:  [phone]  Physical Therapy Treatment  Patient Details  Name: Jessica Rowland MRN: [id number] Date of Birth: [birth year] Referring Provider: Dr. Alain Marion   Encounter Date: 07/01/2017  PT End of Session - 07/01/17 1239    Visit Number  6    Number of Visits  10    Date for PT Re-Evaluation  08/07/17    Authorization Type  G code at visit 10;  KX at visit 15    PT Start Time  1145    PT Stop Time  1229    PT Time Calculation (min)  44 min    Activity Tolerance  Patient tolerated treatment well;No increased pain    Behavior During Therapy  WFL for tasks assessed/performed       Past Medical History:  Diagnosis Date  . Hyperlipidemia     Past Surgical History:  Procedure Laterality Date  . ABDOMINAL HYSTERECTOMY      There were no vitals filed for this visit.  Subjective Assessment - 07/01/17 1148    Subjective  Pt reports that things are going well. She does not have any pain currently and thinks the needling helped alot.     Patient is accompained by:  Interpreter    Pertinent History  hard of hearing ;  speaks [place]    Limitations  House hold activities;Walking;Standing;Sitting    Patient Stated Goals  she hopes she will not have pain and reduce numbness    Currently in Pain?  No/denies                      [facility] Adult PT Treatment/Exercise - 07/01/17 0001      Self-Care   Self-Care  Other Self-Care Comments    Other Self-Care Comments   use of ball for self trigger point release, set up demonstration      Knee/Hip Exercises: Prone   Straight Leg Raises  Both;1 set;15 reps      Manual Therapy   Joint Mobilization  Grade III/IV anterior/lateral/inferior hip mobilizations, Grade III CPAs lumbar spine x2 bouts     Soft tissue mobilization  TPR Rt piriformis, STM lumbar paraspinals/multifidi               PT Education - 07/01/17 1238    Education provided  Yes    Education Details  self trigger point release at home, set up and demonstration    Person(s) Educated  Patient    Methods  Explanation;Verbal cues;Demonstration    Comprehension  Verbalized understanding;Returned demonstration       PT Short Term Goals - 06/26/17 1222      PT SHORT TERM GOAL #1   Title  The patient will demonstrate knowledge of basic self care strategies for pain reduction    Time  4    Period  Weeks    Status  Achieved      PT SHORT TERM GOAL #2   Title  The patient will report a 30% improvement in pain with sitting, walking and standing    Time  4    Period  Weeks    Status  On-going      PT SHORT TERM GOAL #3   Title  The patient will be able to sit 10-12 minutes with minimal increase in pain    Time  4    Period  Weeks  Status  On-going        PT Long Term Goals - 06/12/17 1706      PT LONG TERM GOAL #1   Title  The patient will be independent in safe self progression of HEP    Time  8    Period  Weeks    Status  New    Target Date  08/07/17      PT LONG TERM GOAL #2   Title  The patient will report a 60% improvement in pain with sitting, standing and walking    Time  8    Period  Weeks    Status  New      PT LONG TERM GOAL #3   Title  The patient will have improved right LE strength grossly 4/5 needed for standing, walking and ascending/descending steps to her apartment    Time  8    Period  Weeks    Status  New      PT LONG TERM GOAL #4   Title  The patient will be able to sit for 15 minutes with minimal increase in pain    Time  8    Period  Weeks    Status  New            Plan - 07/01/17 1245    Clinical Impression Statement  Pt arrived reporting resolution of her Rt hip pain following last sessions needling treatment. Today's session focused on manual techniques to address joint and soft tissue restrictions particularly along the piriformis and  low lumbar region. Continue to note significant irritation along the Rt piriformis and demonstrated self trigger point release techniques for the pt complete at home. She verbalized understanding of this and reported no pain following today's session.     Rehab Potential  Good    PT Treatment/Interventions  ADLs/Self Care Home Management;Electrical Stimulation;Cryotherapy;Iontophoresis 4mg /ml Dexamethasone;Moist Heat;Ultrasound;Patient/family education;Neuromuscular re-education;Therapeutic exercise;Therapeutic activities;Manual techniques;Taping;Dry needling    PT Next Visit Plan  progression of trunk/hip strength; dry needle right glutes/piriformis as needed, ionto if cert signed;      PT Home Exercise Plan  hip adduction in standing with band or lying down if tolerated, self trigger point release     Consulted and Agree with Plan of Care  Patient       Patient will benefit from skilled therapeutic intervention in order to improve the following deficits and impairments:  Pain, Decreased activity tolerance, Decreased strength, Difficulty walking, Increased muscle spasms, Impaired sensation  Visit Diagnosis: Pain in right hip  Muscle weakness (generalized)     Problem List Patient Active Problem List   Diagnosis Date Noted  . Skin cancer of face 05/15/2017  . Dizzinesses 05/15/2017  . Vision problems 05/15/2017  . Piriformis syndrome of right side 03/13/2017   12:53 PM,07/01/17 Elly Modena PT, [place] [facility] [address], [place], [place], [address] Phone: [phone]   Fax:  [phone]  Name: Jessica Rowland MRN: [id number] Date of Birth: 07-Nov-1931

## 2017-07-08 ENCOUNTER — Ambulatory Visit: Payer: Medicare Other | Admitting: Physical Therapy

## 2017-07-08 DIAGNOSIS — M6281 Muscle weakness (generalized): Secondary | ICD-10-CM

## 2017-07-08 DIAGNOSIS — M25551 Pain in right hip: Secondary | ICD-10-CM | POA: Diagnosis not present

## 2017-07-08 NOTE — Patient Instructions (Addendum)
x30 second    x10    [facility] [address] [place], [place] [address] Phone # [phone] Fax [phone]

## 2017-07-08 NOTE — Therapy (Signed)
[facility] [address] [place], [place], [address] Phone: [phone]   Fax:  [phone]  Physical Therapy Treatment  Patient Details  Name: Jessica Rowland MRN: [id number] Date of Birth: 10/21/1931 Referring Provider: Dr. Alain Marion   Encounter Date: 07/08/2017  PT End of Session - 07/08/17 1139    Visit Number  7    Number of Visits  10    Date for PT Re-Evaluation  08/07/17    Authorization Type  G code at visit 10;  KX at visit 15    PT Start Time  1101    PT Stop Time  1141    PT Time Calculation (min)  40 min    Activity Tolerance  Patient tolerated treatment well;No increased pain    Behavior During Therapy  WFL for tasks assessed/performed       Past Medical History:  Diagnosis Date  . Hyperlipidemia     Past Surgical History:  Procedure Laterality Date  . ABDOMINAL HYSTERECTOMY      There were no vitals filed for this visit.  Subjective Assessment - 07/08/17 1106    Subjective  Pt reports that she thinks her numbness was much improved following last session, maybe 30% improved overall. She has no pain currently and feels that she can sit and do things without difficulty.     Patient is accompained by:  Interpreter    Pertinent History  hard of hearing ;  speaks [place]    Limitations  House hold activities;Walking;Standing;Sitting    Patient Stated Goals  she hopes she will not have pain and reduce numbness    Currently in Pain?  No/denies                      [facility] Adult PT Treatment/Exercise - 07/08/17 0001      Knee/Hip Exercises: Stretches   Gastroc Stretch  Both;2 reps;30 seconds;Other (comment) long sitting with strap       Knee/Hip Exercises: Supine   Bridges  1 set;10 reps;Limitations    Bridges Limitations  alt knee extension     Other Supine Knee/Hip Exercises  supine bent knee 90/90 with alt LE extension x10 reps each       Knee/Hip Exercises: Sidelying   Other Sidelying Knee/Hip Exercises  thoracic rotation stretch x15 reps each       Manual Therapy   Joint Mobilization  Grade III CPAs lumbar spine x2 bouts     Soft tissue mobilization  TPR Rt glute max        Trigger Point Dry Needling - 07/08/17 1109    Consent Given?  Yes    Education Handout Provided  No    Muscles Treated Upper Body  -- L4/L5/S1 multifidi Rt side (+) twitch response           PT Education - 07/08/17 1138    Education provided  Yes    Education Details  technique with therex     Person(s) Educated  Patient    Methods  Explanation;Tactile cues;Verbal cues    Comprehension  Returned demonstration;Verbalized understanding       PT Short Term Goals - 07/08/17 1143      PT SHORT TERM GOAL #1   Title  The patient will demonstrate knowledge of basic self care strategies for pain reduction    Time  4    Period  Weeks    Status  Achieved      PT  SHORT TERM GOAL #2   Title  The patient will report a 30% improvement in pain with sitting, walking and standing    Time  4    Period  Weeks    Status  Achieved      PT SHORT TERM GOAL #3   Title  The patient will be able to sit 10-12 minutes with minimal increase in pain    Time  4    Period  Weeks    Status  Achieved        PT Long Term Goals - 06/12/17 1706      PT LONG TERM GOAL #1   Title  The patient will be independent in safe self progression of HEP    Time  8    Period  Weeks    Status  New    Target Date  08/07/17      PT LONG TERM GOAL #2   Title  The patient will report a 60% improvement in pain with sitting, standing and walking    Time  8    Period  Weeks    Status  New      PT LONG TERM GOAL #3   Title  The patient will have improved right LE strength grossly 4/5 needed for standing, walking and ascending/descending steps to her apartment    Time  8    Period  Weeks    Status  New      PT LONG TERM GOAL #4   Title  The patient will be able to sit for 15 minutes with minimal  increase in pain    Time  8    Period  Weeks    Status  New            Plan - 07/08/17 1141    Clinical Impression Statement  Pt is making steady progress towards her goals, having arrived today reporting 30% decrease in her numbness along the RLE following last session. Completed manual techniques and dry needling this session to further address muscle restrictions. Also completed core stabilization with pt able to demonstrate proper technique with only minor cues from the therapist. Ended session with additions made to pt's HEP and no report of increase in pain/numbness following today's activities.     Rehab Potential  Good    PT Treatment/Interventions  ADLs/Self Care Home Management;Electrical Stimulation;Cryotherapy;Iontophoresis 4mg /ml Dexamethasone;Moist Heat;Ultrasound;Patient/family education;Neuromuscular re-education;Therapeutic exercise;Therapeutic activities;Manual techniques;Taping;Dry needling    PT Next Visit Plan  progression of trunk/hip strength (quadruped/standing); dry needle right glutes/piriformis as needed, ionto if needed     PT Home Exercise Plan  hip adduction in standing with band or lying down if tolerated, self trigger point release     Consulted and Agree with Plan of Care  Patient       Patient will benefit from skilled therapeutic intervention in order to improve the following deficits and impairments:  Pain, Decreased activity tolerance, Decreased strength, Difficulty walking, Increased muscle spasms, Impaired sensation  Visit Diagnosis: Pain in right hip  Muscle weakness (generalized)     Problem List Patient Active Problem List   Diagnosis Date Noted  . Skin cancer of face 05/15/2017  . Dizzinesses 05/15/2017  . Vision problems 05/15/2017  . Piriformis syndrome of right side 03/13/2017    11:44 AM,07/08/17 Elly Modena PT, [place] [facility] [address] [place], [place], [address] Phone: [phone]  Fax:  [phone]  Name: Jessica Rowland MRN: [id number] Date of Birth: 01/17/1932

## 2017-07-10 ENCOUNTER — Ambulatory Visit: Payer: Medicare Other | Admitting: Physical Therapy

## 2017-07-10 DIAGNOSIS — M25551 Pain in right hip: Secondary | ICD-10-CM | POA: Diagnosis not present

## 2017-07-10 DIAGNOSIS — M6281 Muscle weakness (generalized): Secondary | ICD-10-CM | POA: Diagnosis not present

## 2017-07-10 NOTE — Therapy (Signed)
[facility] [address] [place], [place], [address] Phone: [phone]   Fax:  [phone]  Physical Therapy Treatment  Patient Details  Name: Jessica Rowland MRN: [id number] Date of Birth: 02-06-1932 Referring Provider: Dr. Alain Marion   Encounter Date: 07/10/2017  PT End of Session - 07/10/17 1135    Visit Number  8    Number of Visits  10    Date for PT Re-Evaluation  08/07/17    Authorization Type  G code at visit 10;  KX at visit 15    PT Start Time  1100    PT Stop Time  1139    PT Time Calculation (min)  39 min    Activity Tolerance  Patient tolerated treatment well;No increased pain    Behavior During Therapy  WFL for tasks assessed/performed       Past Medical History:  Diagnosis Date  . Hyperlipidemia     Past Surgical History:  Procedure Laterality Date  . ABDOMINAL HYSTERECTOMY      There were no vitals filed for this visit.  Subjective Assessment - 07/10/17 1105    Subjective  Pt reports that her numbness is much improved after last session. She has no pain and continues to work on her ball in the hip.     Patient is accompained by:  Interpreter    Pertinent History  hard of hearing ;  speaks [place]    Limitations  House hold activities;Walking;Standing;Sitting    Patient Stated Goals  she hopes she will not have pain and reduce numbness    Currently in Pain?  No/denies                      [facility] Adult PT Treatment/Exercise - 07/10/17 0001      Knee/Hip Exercises: Seated   Sit to Sand  2 sets;without UE support;Other (comment);5 reps      Knee/Hip Exercises: Supine   Other Supine Knee/Hip Exercises  low trunk rotation with LE propped on red physioball x20 reps      Knee/Hip Exercises: Prone   Straight Leg Raises  Both;2 sets;10 reps;Limitations    Straight Leg Raises Limitations  2nd set with 2# ankle weights       Manual Therapy   Joint Mobilization  Grade III  CPAs lumbar spine x2 bouts     Soft tissue mobilization  TPR Rt glute max and piriformis              PT Education - 07/10/17 1134    Education provided  Yes    Education Details  technique with therex; implications for theraband resistance during exercises    Person(s) Educated  Patient    Methods  Explanation via language interpreter    Comprehension  Verbalized understanding       PT Short Term Goals - 07/08/17 1143      PT SHORT TERM GOAL #1   Title  The patient will demonstrate knowledge of basic self care strategies for pain reduction    Time  4    Period  Weeks    Status  Achieved      PT SHORT TERM GOAL #2   Title  The patient will report a 30% improvement in pain with sitting, walking and standing    Time  4    Period  Weeks    Status  Achieved      PT SHORT TERM GOAL #3   Title  The patient will be able to sit 10-12 minutes with minimal increase in pain    Time  4    Period  Weeks    Status  Achieved        PT Long Term Goals - 06/12/17 1706      PT LONG TERM GOAL #1   Title  The patient will be independent in safe self progression of HEP    Time  8    Period  Weeks    Status  New    Target Date  08/07/17      PT LONG TERM GOAL #2   Title  The patient will report a 60% improvement in pain with sitting, standing and walking    Time  8    Period  Weeks    Status  New      PT LONG TERM GOAL #3   Title  The patient will have improved right LE strength grossly 4/5 needed for standing, walking and ascending/descending steps to her apartment    Time  8    Period  Weeks    Status  New      PT LONG TERM GOAL #4   Title  The patient will be able to sit for 15 minutes with minimal increase in pain    Time  8    Period  Weeks    Status  New            Plan - 07/10/17 1136    Clinical Impression Statement  Pt arrives reporting more improvements in her numbness following last session. Continued today with gentle lumbar/sacral mobilizations  followed by exercise to improve pt's core/hip strength and stability. Introduced sit to stand activity with feedback to encourage knee valgus control, however this proved to be quite difficult for the pt secondary muscle weakness and poor hip stability. Ended session without report of pain, will continue with current POC.     Rehab Potential  Good    PT Treatment/Interventions  ADLs/Self Care Home Management;Electrical Stimulation;Cryotherapy;Iontophoresis 4mg /ml Dexamethasone;Moist Heat;Ultrasound;Patient/family education;Neuromuscular re-education;Therapeutic exercise;Therapeutic activities;Manual techniques;Taping;Dry needling    PT Next Visit Plan  progression of trunk/hip strength (quadruped/standing); dry needle right glutes/piriformis as needed, ionto if needed     PT Home Exercise Plan  hip adduction in standing with band or lying down if tolerated, self trigger point release     Consulted and Agree with Plan of Care  Patient       Patient will benefit from skilled therapeutic intervention in order to improve the following deficits and impairments:  Pain, Decreased activity tolerance, Decreased strength, Difficulty walking, Increased muscle spasms, Impaired sensation  Visit Diagnosis: Pain in right hip  Muscle weakness (generalized)     Problem List Patient Active Problem List   Diagnosis Date Noted  . Skin cancer of face 05/15/2017  . Dizzinesses 05/15/2017  . Vision problems 05/15/2017  . Piriformis syndrome of right side 03/13/2017   11:50 AM,07/10/17 Elly Modena PT, DPT [facility] [address], [place], [place], [address] Phone: [phone]   Fax:  [phone]  Name: Jessica Rowland MRN: [id number] Date of Birth: 10-May-1932

## 2017-07-15 ENCOUNTER — Ambulatory Visit: Payer: Medicare Other | Attending: Internal Medicine | Admitting: Physical Therapy

## 2017-07-15 DIAGNOSIS — M25551 Pain in right hip: Secondary | ICD-10-CM

## 2017-07-15 DIAGNOSIS — M6281 Muscle weakness (generalized): Secondary | ICD-10-CM | POA: Insufficient documentation

## 2017-07-15 NOTE — Therapy (Signed)
[facility] [address] [place], [place], [address] Phone: [phone]   Fax:  [phone]  Physical Therapy Treatment  Patient Details  Name: Jessica Rowland MRN: [id number] Date of Birth: [birth year] Referring Provider: Dr. Alain Marion   Encounter Date: 07/15/2017  PT End of Session - 07/15/17 1145    Visit Number  9    Number of Visits  10    Date for PT Re-Evaluation  08/07/17    Authorization Type  G code at visit 10;  KX at visit 15    PT Start Time  1101    PT Stop Time  1144    PT Time Calculation (min)  43 min    Activity Tolerance  Patient tolerated treatment well;No increased pain    Behavior During Therapy  WFL for tasks assessed/performed       Past Medical History:  Diagnosis Date  . Hyperlipidemia     Past Surgical History:  Procedure Laterality Date  . ABDOMINAL HYSTERECTOMY      There were no vitals filed for this visit.  Subjective Assessment - 07/15/17 1105    Subjective  Pt reports that she continues to notice improvements in her numbness/symptoms. She also thinks that the needling is helping her alot as well. She denies any pain and continues to have some numbness which is much less than normal (along the Rt lateral thigh down to the foot).    Patient is accompained by:  Interpreter    Pertinent History  hard of hearing ;  speaks [place]    Limitations  House hold activities;Walking;Standing;Sitting    Patient Stated Goals  she hopes she will not have pain and reduce numbness    Currently in Pain?  No/denies                      [facility] Adult PT Treatment/Exercise - 07/15/17 0001      Knee/Hip Exercises: Seated   Sit to Sand  2 sets;5 reps;without UE support;Other (comment) 2nd set with foam pad       Knee/Hip Exercises: Supine   Other Supine Knee/Hip Exercises  RLE nerve glide x10 reps       Knee/Hip Exercises: Prone   Other Prone Exercises  quadruped LE reach x5  reps on the Rt, x8 reps on the Lt with therapist providing RNT press into Lt hip      Manual Therapy   Joint Mobilization  T10 to L5 Grade III CPA mobilization        Trigger Point Dry Needling - 07/15/17 1142    Consent Given?  Yes    Education Handout Provided  No pt declined     Muscles Treated Upper Body  -- Rt multifidi L3/L4/L5/S1 (+) twitch response           PT Education - 07/15/17 1144    Education provided  Yes    Education Details  encouraged pt to utilize the massage ball for 3 min at a time and split into 2-3x/day rather than suffering through a painful 10 min    Person(s) Educated  Patient    Methods  Explanation    Comprehension  Verbalized understanding       PT Short Term Goals - 07/08/17 1143      PT SHORT TERM GOAL #1   Title  The patient will demonstrate knowledge of basic self care strategies for pain reduction    Time  4  Period  Weeks    Status  Achieved      PT SHORT TERM GOAL #2   Title  The patient will report a 30% improvement in pain with sitting, walking and standing    Time  4    Period  Weeks    Status  Achieved      PT SHORT TERM GOAL #3   Title  The patient will be able to sit 10-12 minutes with minimal increase in pain    Time  4    Period  Weeks    Status  Achieved        PT Long Term Goals - 06/12/17 1706      PT LONG TERM GOAL #1   Title  The patient will be independent in safe self progression of HEP    Time  8    Period  Weeks    Status  New    Target Date  08/07/17      PT LONG TERM GOAL #2   Title  The patient will report a 60% improvement in pain with sitting, standing and walking    Time  8    Period  Weeks    Status  New      PT LONG TERM GOAL #3   Title  The patient will have improved right LE strength grossly 4/5 needed for standing, walking and ascending/descending steps to her apartment    Time  8    Period  Weeks    Status  New      PT LONG TERM GOAL #4   Title  The patient will be able to sit  for 15 minutes with minimal increase in pain    Time  8    Period  Weeks    Status  New            Plan - 07/15/17 1147    Clinical Impression Statement  Pt arrived today reporting continued improvements in her numbness down the leg. She was able to complete her sit to stand exercises this session with improved knee valgus control/awareness, requiring no additional cuing from the therapist. Introduced nerve glides to further address neural tension in the RLE, and ended with manual techniques and gentle mobilization to further address lumbar spine mobility. Therapist made adjustments to pt's HEP to improve comfort and adherence and she verbalized understanding of this. Will continue with current POC.     Rehab Potential  Good    PT Treatment/Interventions  ADLs/Self Care Home Management;Electrical Stimulation;Cryotherapy;Iontophoresis 4mg /ml Dexamethasone;Moist Heat;Ultrasound;Patient/family education;Neuromuscular re-education;Therapeutic exercise;Therapeutic activities;Manual techniques;Taping;Dry needling    PT Next Visit Plan  progression of trunk/hip strength (quadruped/standing); dry needle right glutes/piriformis as needed, ionto if needed     PT Home Exercise Plan  hip adduction in standing with band or lying down if tolerated, self trigger point release     Consulted and Agree with Plan of Care  Patient       Patient will benefit from skilled therapeutic intervention in order to improve the following deficits and impairments:  Pain, Decreased activity tolerance, Decreased strength, Difficulty walking, Increased muscle spasms, Impaired sensation  Visit Diagnosis: Pain in right hip  Muscle weakness (generalized)     Problem List Patient Active Problem List   Diagnosis Date Noted  . Skin cancer of face 05/15/2017  . Dizzinesses 05/15/2017  . Vision problems 05/15/2017  . Piriformis syndrome of right side 03/13/2017     11:48 AM,07/15/17 Elly Modena PT, DPT [facility]  [place] [address] [place], [place], [address] Phone: [phone]   Fax:  [phone]  Name: Jessica Rowland MRN: [id number] Date of Birth: [birth year]

## 2017-07-17 ENCOUNTER — Ambulatory Visit: Payer: Medicare Other | Admitting: Physical Therapy

## 2017-07-17 DIAGNOSIS — M25551 Pain in right hip: Secondary | ICD-10-CM | POA: Diagnosis not present

## 2017-07-17 DIAGNOSIS — M6281 Muscle weakness (generalized): Secondary | ICD-10-CM | POA: Diagnosis not present

## 2017-07-17 NOTE — Therapy (Signed)
[facility] [address], [place], [place], [address] Phone: [phone]   Fax:  [phone]  Physical Therapy Treatment/Discharge  Patient Details  Name: Jessica Rowland MRN: [id number] Date of Birth: 1931/08/16 Referring Provider: Dr. Alain Marion   Encounter Date: 07/17/2017  PT End of Session - 07/17/17 1239    Visit Number  10    Number of Visits  10    Date for PT Re-Evaluation  08/07/17    Authorization Type  G code at visit 10;  KX at visit 15    PT Start Time  1105    PT Stop Time  1144    PT Time Calculation (min)  39 min    Activity Tolerance  Patient tolerated treatment well;No increased pain    Behavior During Therapy  WFL for tasks assessed/performed       Past Medical History:  Diagnosis Date  . Hyperlipidemia     Past Surgical History:  Procedure Laterality Date  . ABDOMINAL HYSTERECTOMY      There were no vitals filed for this visit.  Subjective Assessment - 07/17/17 1105    Subjective  Pt reports no more numbness in her leg other than her toes. She continues to work on her exercises diligently without any issues.     Patient is accompained by:  Interpreter    Pertinent History  hard of hearing ;  speaks [place]    Limitations  House hold activities;Walking;Standing;Sitting    Patient Stated Goals  she hopes she will not have pain and reduce numbness    Currently in Pain?  No/denies numbness only in 1st 3 toes on the Rt          [facility] PT Assessment - 07/17/17 0001      Assessment   Medical Diagnosis  right piriformis syndrome    Referring Provider  Dr. Alain Marion    Onset Date/Surgical Date  -- 10 years    Next MD Visit  Jan 2019    Prior Therapy  no      Precautions   Precautions  None      Restrictions   Weight Bearing Restrictions  No      Balance Screen   Has the patient fallen in the past 6 months  No    Has the patient had a decrease in activity level because of a fear  of falling?   No    Is the patient reluctant to leave their home because of a fear of falling?   No      Home Environment   Living Environment  Private residence    Living Arrangements  Alone    Available Help at Discharge  Family    Type of [place] to enter    Entrance Stairs-Number of Steps  12 difficult to go up stairs    Home Layout  One level    [place]  None      Prior Function   Level of Independence  Independent with basic ADLs    Vocation  Retired    Leisure  reading      Observation/Other Assessments   Focus on Therapeutic Outcomes (FOTO)   non English speaking      Sensation   Light Touch  -- decreased sensation to light touch Rt 1st 3 toes       Posture/Postural Control   Posture Comments  decreased lumbar lordosis, increased thoracic kyphosis  Strength   Right Hip Flexion  4+/5    Right Hip Extension  5/5    Right Hip External Rotation   5/5    Right Hip ABduction  4+/5    Lumbar Flexion  --      Flexibility   Soft Tissue Assessment /Muscle Length  -- able to touch toes in long sitting position      Palpation   Palpation comment  tender points in right piriformis       Special Tests    Special Tests  -- SLS on right 5 sec without UE support      Slump test   Findings  Negative    Side  --      Straight Leg Raise   Findings  --    Side   --                  [facility] Adult PT Treatment/Exercise - 07/17/17 0001      Self-Care   Other Self-Care Comments   use of lumbar roll in low back when sitting to improve posture       Knee/Hip Exercises: Supine   Other Supine Knee/Hip Exercises  RLE nerve glide x10 reps       Knee/Hip Exercises: Sidelying   Other Sidelying Knee/Hip Exercises  thoracic rotation stretch x15 reps each       Knee/Hip Exercises: Prone   Other Prone Exercises  pressup onto hands (elbows bent) x15 reps       Manual Therapy   Joint Mobilization  Lumbar and sacral mobilization x3  bouts             PT Education - 07/17/17 1240    Education provided  Yes    Education Details  use of lumbar roll at home for improved posture; reviewed goals; encouraged pt to continue with HEP and use of tennis ball for self massage    Person(s) Educated  Patient    Methods  Explanation via language interpreter     Comprehension  Verbalized understanding       PT Short Term Goals - 07/17/17 1126      PT SHORT TERM GOAL #1   Title  The patient will demonstrate knowledge of basic self care strategies for pain reduction    Time  4    Period  Weeks    Status  Achieved      PT SHORT TERM GOAL #2   Title  The patient will report a 30% improvement in pain with sitting, walking and standing    Time  4    Period  Weeks    Status  Achieved      PT SHORT TERM GOAL #3   Title  The patient will be able to sit 10-12 minutes with minimal increase in pain    Time  4    Period  Weeks    Status  Achieved        PT Long Term Goals - 07/17/17 1126      PT LONG TERM GOAL #1   Title  The patient will be independent in safe self progression of HEP    Time  8    Period  Weeks    Status  Achieved      PT LONG TERM GOAL #2   Title  The patient will report a 60% improvement in pain with sitting, standing and walking    Baseline  70% improved atleast  Time  8    Period  Weeks    Status  Achieved      PT LONG TERM GOAL #3   Title  The patient will have improved right LE strength grossly 4/5 needed for standing, walking and ascending/descending steps to her apartment    Time  8    Period  Weeks    Status  Achieved      PT LONG TERM GOAL #4   Title  The patient will be able to sit for 15 minutes with minimal increase in pain    Baseline  unlimited     Time  8    Period  Weeks    Status  Achieved            Plan - 08-05-17 1239    Clinical Impression Statement  Pt was discharged this visit having met all of her short and long term goals. She reports resolution of  her pain 100% and atleast 70% improvement in her numbness, noting this is decreased to just her 1st 3 toes on the Rt foot now. She has been consistently completing her HEP with little difficulty and demonstrates improved RLE strength as well. Her sitting and walking tolerance has improved significantly, pt reporting no limitations with this currently. She is very pleased with her progress and feels comfortable continuing with her HEP from this point to ensure that she continues to maintain her improvements from this point forward.     Rehab Potential  Good    PT Treatment/Interventions  ADLs/Self Care Home Management;Electrical Stimulation;Cryotherapy;Iontophoresis '4mg'$ /ml Dexamethasone;Moist Heat;Ultrasound;Patient/family education;Neuromuscular re-education;Therapeutic exercise;Therapeutic activities;Manual techniques;Taping;Dry needling    PT Next Visit Plan  progression of trunk/hip strength (quadruped/standing); dry needle right glutes/piriformis as needed, ionto if needed     PT Home Exercise Plan  hip adduction in standing with band or lying down if tolerated, self trigger point release     Consulted and Agree with Plan of Care  Patient       Patient will benefit from skilled therapeutic intervention in order to improve the following deficits and impairments:  Pain, Decreased activity tolerance, Decreased strength, Difficulty walking, Increased muscle spasms, Impaired sensation  Visit Diagnosis: Pain in right hip  Muscle weakness (generalized)   G-Codes - August 05, 2017 1247    Functional Assessment Tool Used (Outpatient Only)  Clinical judgement    Functional Limitation  Mobility: Walking and moving around    Mobility: Walking and Moving Around Goal Status [phone])  At least 20 percent but less than 40 percent impaired, limited or restricted    Mobility: Walking and Moving Around Discharge Status [phone])  At least 20 percent but less than 40 percent impaired, limited or restricted        Problem List Patient Active Problem List   Diagnosis Date Noted  . Skin cancer of face 05/15/2017  . Dizzinesses 05/15/2017  . Vision problems 05/15/2017  . Piriformis syndrome of right side 03/13/2017     PHYSICAL THERAPY DISCHARGE SUMMARY  Visits from Start of Care: 10  Current functional level related to goals / functional outcomes: See above for more details    Remaining deficits: See above for more details    Education / Equipment: See above for more details  Plan: Patient agrees to discharge.  Patient goals were met. Patient is being discharged due to meeting the stated rehab goals.  ?????    12:50 PM,August 05, 2017 [place] [place] [place]  [facility] [address], [place], [place], [address] Phone: [phone]   Fax:  [phone]  Name: Jessica Rowland MRN: [id number] Date of Birth: 04-21-1932

## 2017-08-21 DIAGNOSIS — H25013 Cortical age-related cataract, bilateral: Secondary | ICD-10-CM | POA: Diagnosis not present

## 2017-08-21 DIAGNOSIS — H2512 Age-related nuclear cataract, left eye: Secondary | ICD-10-CM | POA: Diagnosis not present

## 2017-08-21 DIAGNOSIS — H268 Other specified cataract: Secondary | ICD-10-CM | POA: Diagnosis not present

## 2017-08-21 DIAGNOSIS — H2513 Age-related nuclear cataract, bilateral: Secondary | ICD-10-CM | POA: Diagnosis not present

## 2017-08-21 DIAGNOSIS — H353132 Nonexudative age-related macular degeneration, bilateral, intermediate dry stage: Secondary | ICD-10-CM | POA: Diagnosis not present

## 2017-08-26 ENCOUNTER — Ambulatory Visit (INDEPENDENT_AMBULATORY_CARE_PROVIDER_SITE_OTHER): Payer: Medicare Other | Admitting: Internal Medicine

## 2017-08-26 ENCOUNTER — Encounter: Payer: Self-pay | Admitting: Internal Medicine

## 2017-08-26 VITALS — BP 132/64 | HR 76 | Temp 97.9°F | Ht 62.0 in | Wt 102.0 lb

## 2017-08-26 DIAGNOSIS — K811 Chronic cholecystitis: Secondary | ICD-10-CM

## 2017-08-26 DIAGNOSIS — R42 Dizziness and giddiness: Secondary | ICD-10-CM

## 2017-08-26 DIAGNOSIS — Z23 Encounter for immunization: Secondary | ICD-10-CM

## 2017-08-26 DIAGNOSIS — G5701 Lesion of sciatic nerve, right lower limb: Secondary | ICD-10-CM

## 2017-08-26 DIAGNOSIS — H547 Unspecified visual loss: Secondary | ICD-10-CM

## 2017-08-26 DIAGNOSIS — C44300 Unspecified malignant neoplasm of skin of unspecified part of face: Secondary | ICD-10-CM

## 2017-08-26 DIAGNOSIS — K8020 Calculus of gallbladder without cholecystitis without obstruction: Secondary | ICD-10-CM | POA: Insufficient documentation

## 2017-08-26 MED ORDER — AMOXICILLIN 500 MG PO CAPS: 500.0000 mg | ORAL_CAPSULE | Freq: Three times a day (TID) | ORAL | 0 refills | Status: DC

## 2017-08-26 NOTE — Assessment & Plan Note (Signed)
Better w/PT 

## 2017-08-26 NOTE — Addendum Note (Signed)
Addended by: Cassandria Anger on: 08/26/2017 01:54 PM   Modules accepted: Orders

## 2017-08-26 NOTE — Assessment & Plan Note (Addendum)
Abd [place] - pt declined [place]. May agree later.

## 2017-08-26 NOTE — Progress Notes (Signed)
Subjective:  Patient ID: Jessica Rowland, female    DOB: [birth year]  Age: 84 y.o. MRN: [id number]  CC: No chief complaint on file.   HPI Jessica Rowland presents for R hip pain - much better C/o gum pain H/o cholecystis - ?pain at times  Outpatient Medications Prior to Visit  Medication Sig Dispense Refill  . aspirin EC 81 MG tablet Take 81 mg by mouth daily.    Marland Kitchen b complex vitamins tablet Take 1 tablet by mouth daily. 100 tablet 3  . Cholecalciferol (VITAMIN D3) 2000 units capsule Take 1 capsule (2,000 Units total) by mouth daily. 100 capsule 3  . Ginkgo Biloba 120 MG CAPS Take 120 mg by mouth 3 (three) times daily.    . Omega-3 Fatty Acids (FISH OIL) 1000 MG CAPS Take 1,000 mg by mouth 2 (two) times daily.    . Vinpocetine POWD 30 mg by Does not apply route 3 (three) times daily.    . naproxen sodium (ALEVE) 220 MG tablet Take 1 tablet (220 mg total) by mouth daily after breakfast. 30 tablet 1   No facility-administered medications prior to visit.     ROS Review of Systems  Constitutional: Negative for activity change, appetite change, chills, fatigue and unexpected weight change.  HENT: Negative for congestion, mouth sores and sinus pressure.   Eyes: Negative for visual disturbance.  Respiratory: Negative for cough and chest tightness.   Gastrointestinal: Negative for abdominal pain and nausea.  Genitourinary: Negative for difficulty urinating, frequency and vaginal pain.  Musculoskeletal: Negative for back pain and gait problem.  Skin: Negative for pallor and rash.  Neurological: Negative for dizziness, tremors, weakness, numbness and headaches.  Psychiatric/Behavioral: Negative for confusion and sleep disturbance.    Objective:  BP 132/64 (BP Location: Left Arm, Patient Position: Sitting, Cuff Size: Normal)   Pulse 76   Temp 97.9 F (36.6 C) (Oral)   Ht 5\' 2"  (1.575 m)   Wt 102 lb (46.3 kg)   SpO2 99%   BMI 18.66 kg/m   BP Readings from Last 3 Encounters:    08/26/17 132/64  05/15/17 138/64  03/13/17 116/62    Wt Readings from Last 3 Encounters:  08/26/17 102 lb (46.3 kg)  05/15/17 99 lb (44.9 kg)  03/13/17 101 lb (45.8 kg)    Physical Exam  Constitutional: She appears well-developed. No distress.  HENT:  Head: Normocephalic.  Right Ear: External ear normal.  Left Ear: External ear normal.  Nose: Nose normal.  Mouth/Throat: Oropharynx is clear and moist.  Eyes: Conjunctivae are normal. Pupils are equal, round, and reactive to light. Right eye exhibits no discharge. Left eye exhibits no discharge.  Neck: Normal range of motion. Neck supple. No JVD present. No tracheal deviation present. No thyromegaly present.  Cardiovascular: Normal rate, regular rhythm and normal heart sounds.  Pulmonary/Chest: No stridor. No respiratory distress. She has no wheezes.  Abdominal: Soft. Bowel sounds are normal. She exhibits no distension and no mass. There is no tenderness. There is no rebound and no guarding.  Musculoskeletal: She exhibits tenderness. She exhibits no edema.  Lymphadenopathy:    She has no cervical adenopathy.  Neurological: She displays normal reflexes. No cranial nerve deficit. She exhibits normal muscle tone. Coordination normal.  Skin: No rash noted. No erythema.  Psychiatric: She has a normal mood and affect. Her behavior is normal. Judgment and thought content normal.    PCS Form filled out  Lab Results  Component Value Date   WBC 5.8 05/15/2017  HGB 13.8 05/15/2017   HCT 41.2 05/15/2017   PLT 300.0 05/15/2017   GLUCOSE 99 05/15/2017   ALT 15 05/15/2017   AST 14 05/15/2017   NA 132 (L) 05/15/2017   K 3.9 05/15/2017   CL 99 05/15/2017   CREATININE 0.55 05/15/2017   BUN 8 05/15/2017   CO2 26 05/15/2017   TSH 0.62 05/15/2017    Patient was never admitted.  Assessment & Plan:   There are no diagnoses linked to this encounter. I have discontinued Jessica Rowland's naproxen sodium. I am also having her maintain  her Fish Oil, Ginkgo Biloba, Vinpocetine, Vitamin D3, b complex vitamins, and aspirin EC.  No orders of the defined types were placed in this encounter.    Follow-up: No Follow-up on file.  Walker Kehr, MD

## 2017-08-26 NOTE — Assessment & Plan Note (Signed)
?  cataracts related

## 2017-08-26 NOTE — Addendum Note (Signed)
Addended by: Karren Cobble on: 08/26/2017 01:55 PM   Modules accepted: Orders

## 2017-08-26 NOTE — Assessment & Plan Note (Signed)
F/u w/Derm - pending

## 2017-09-08 DIAGNOSIS — L821 Other seborrheic keratosis: Secondary | ICD-10-CM | POA: Diagnosis not present

## 2017-09-08 DIAGNOSIS — L570 Actinic keratosis: Secondary | ICD-10-CM | POA: Diagnosis not present

## 2017-09-08 DIAGNOSIS — D049 Carcinoma in situ of skin, unspecified: Secondary | ICD-10-CM | POA: Diagnosis not present

## 2017-09-08 DIAGNOSIS — D229 Melanocytic nevi, unspecified: Secondary | ICD-10-CM | POA: Diagnosis not present

## 2017-09-09 DIAGNOSIS — H25812 Combined forms of age-related cataract, left eye: Secondary | ICD-10-CM | POA: Diagnosis not present

## 2017-09-09 DIAGNOSIS — H2512 Age-related nuclear cataract, left eye: Secondary | ICD-10-CM | POA: Diagnosis not present

## 2017-10-01 DIAGNOSIS — H25011 Cortical age-related cataract, right eye: Secondary | ICD-10-CM | POA: Diagnosis not present

## 2017-10-01 DIAGNOSIS — H2511 Age-related nuclear cataract, right eye: Secondary | ICD-10-CM | POA: Diagnosis not present

## 2017-10-07 DIAGNOSIS — H25811 Combined forms of age-related cataract, right eye: Secondary | ICD-10-CM | POA: Diagnosis not present

## 2017-10-07 DIAGNOSIS — H2511 Age-related nuclear cataract, right eye: Secondary | ICD-10-CM | POA: Diagnosis not present

## 2017-10-20 DIAGNOSIS — L309 Dermatitis, unspecified: Secondary | ICD-10-CM | POA: Diagnosis not present

## 2017-10-20 DIAGNOSIS — C44319 Basal cell carcinoma of skin of other parts of face: Secondary | ICD-10-CM | POA: Diagnosis not present

## 2017-10-20 DIAGNOSIS — C4491 Basal cell carcinoma of skin, unspecified: Secondary | ICD-10-CM

## 2017-10-20 HISTORY — DX: Basal cell carcinoma of skin, unspecified: C44.91

## 2017-11-20 DIAGNOSIS — C44319 Basal cell carcinoma of skin of other parts of face: Secondary | ICD-10-CM | POA: Diagnosis not present

## 2017-12-26 ENCOUNTER — Ambulatory Visit (INDEPENDENT_AMBULATORY_CARE_PROVIDER_SITE_OTHER): Payer: Medicare Other | Admitting: Internal Medicine

## 2017-12-26 ENCOUNTER — Other Ambulatory Visit (INDEPENDENT_AMBULATORY_CARE_PROVIDER_SITE_OTHER): Payer: Medicare Other

## 2017-12-26 ENCOUNTER — Encounter: Payer: Self-pay | Admitting: Internal Medicine

## 2017-12-26 VITALS — BP 134/66 | HR 74 | Temp 98.9°F | Ht 62.0 in | Wt 103.0 lb

## 2017-12-26 DIAGNOSIS — H9193 Unspecified hearing loss, bilateral: Secondary | ICD-10-CM | POA: Diagnosis not present

## 2017-12-26 DIAGNOSIS — R109 Unspecified abdominal pain: Secondary | ICD-10-CM | POA: Diagnosis not present

## 2017-12-26 DIAGNOSIS — R0989 Other specified symptoms and signs involving the circulatory and respiratory systems: Secondary | ICD-10-CM | POA: Insufficient documentation

## 2017-12-26 DIAGNOSIS — R202 Paresthesia of skin: Secondary | ICD-10-CM

## 2017-12-26 DIAGNOSIS — H9190 Unspecified hearing loss, unspecified ear: Secondary | ICD-10-CM | POA: Insufficient documentation

## 2017-12-26 LAB — BASIC METABOLIC PANEL
BUN: 12 mg/dL (ref 6–23)
CALCIUM: 9.2 mg/dL (ref 8.4–10.5)
CO2: 26 mEq/L (ref 19–32)
CREATININE: 0.54 mg/dL (ref 0.40–1.20)
Chloride: 96 mEq/L (ref 96–112)
GFR: 114.48 mL/min (ref >60.00)
Glucose, Bld: 100 mg/dL — ABNORMAL HIGH (ref 70–99)
Potassium: 4.6 mEq/L (ref 3.5–5.1)
SODIUM: 130 meq/L — AB (ref 135–145)

## 2017-12-26 LAB — CBC WITH DIFFERENTIAL/PLATELET
BASOS PCT: 0.7 % (ref 0.0–3.0)
Basophils Absolute: 0.1 10*3/uL (ref 0.0–0.1)
EOS ABS: 0 10*3/uL (ref 0.0–0.7)
Eosinophils Relative: 0.4 % (ref 0.0–5.0)
HCT: 39.9 % (ref 36.0–46.0)
HEMOGLOBIN: 13.7 g/dL (ref 12.0–15.0)
Lymphocytes Relative: 25.2 % (ref 12.0–46.0)
Lymphs Abs: 2.1 10*3/uL (ref 0.7–4.0)
MCHC: 34.3 g/dL (ref 30.0–36.0)
MCV: 90.2 fl (ref 78.0–100.0)
MONO ABS: 0.5 10*3/uL (ref 0.1–1.0)
Monocytes Relative: 5.9 % (ref 3.0–12.0)
Neutro Abs: 5.7 10*3/uL (ref 1.4–7.7)
Neutrophils Relative %: 67.8 % (ref 43.0–77.0)
Platelets: 279 10*3/uL (ref 150.0–400.0)
RBC: 4.42 Mil/uL (ref 3.87–5.11)
RDW: 13.1 % (ref 11.5–15.5)
WBC: 8.4 10*3/uL (ref 4.0–10.5)

## 2017-12-26 LAB — HEPATIC FUNCTION PANEL
ALBUMIN: 4.1 g/dL (ref 3.5–5.2)
ALT: 17 U/L (ref 0–35)
AST: 15 U/L (ref 0–37)
Alkaline Phosphatase: 49 U/L (ref 39–117)
Bilirubin, Direct: 0.1 mg/dL (ref 0.0–0.3)
Total Bilirubin: 0.6 mg/dL (ref 0.2–1.2)
Total Protein: 7 g/dL (ref 6.0–8.3)

## 2017-12-26 LAB — URINALYSIS, ROUTINE W REFLEX MICROSCOPIC
BILIRUBIN URINE: NEGATIVE
Hgb urine dipstick: NEGATIVE
KETONES UR: NEGATIVE
Nitrite: NEGATIVE
PH: 6.5 (ref 5.0–8.0)
RBC / HPF: NONE SEEN (ref 0–?)
Total Protein, Urine: NEGATIVE
URINE GLUCOSE: NEGATIVE
UROBILINOGEN UA: 0.2 (ref 0.0–1.0)

## 2017-12-26 LAB — VITAMIN B12: VITAMIN B 12: 782 pg/mL (ref 211–911)

## 2017-12-26 LAB — TSH: TSH: 1.23 u[IU]/mL (ref 0.35–4.50)

## 2017-12-26 LAB — H. PYLORI ANTIBODY, IGG: H PYLORI IGG: POSITIVE — AB

## 2017-12-26 NOTE — Assessment & Plan Note (Signed)
ENT ref 

## 2017-12-26 NOTE — Assessment & Plan Note (Signed)
Epigastric - chronic Abd [place] Labs, H pylori

## 2017-12-26 NOTE — Progress Notes (Signed)
Subjective:  Patient ID: Jessica Rowland, female    DOB: 1932/02/10  Age: 84 y.o. MRN: [id number]  CC: No chief complaint on file.   HPI Jessica Rowland presents for R leg - pain is better C/o numbness in the leg and down to toes #3-4-5 xmonths C/o upper abd pain x years off an on C/o occ heartburn C/o hearing loss  Outpatient Medications Prior to Visit  Medication Sig Dispense Refill  . amoxicillin (AMOXIL) 500 MG capsule Take 1 capsule (500 mg total) by mouth 3 (three) times daily. 30 capsule 0  . aspirin EC 81 MG tablet Take 81 mg by mouth daily.    Marland Kitchen b complex vitamins tablet Take 1 tablet by mouth daily. 100 tablet 3  . Cholecalciferol (VITAMIN D3) 2000 units capsule Take 1 capsule (2,000 Units total) by mouth daily. 100 capsule 3  . Ginkgo Biloba 120 MG CAPS Take 120 mg by mouth 3 (three) times daily.    . Omega-3 Fatty Acids (FISH OIL) 1000 MG CAPS Take 1,000 mg by mouth 2 (two) times daily.    . Vinpocetine POWD 30 mg by Does not apply route 3 (three) times daily.     No facility-administered medications prior to visit.     ROS Review of Systems  Constitutional: Negative for activity change, appetite change, chills, fatigue and unexpected weight change.  HENT: Positive for hearing loss. Negative for congestion, mouth sores and sinus pressure.   Eyes: Negative for visual disturbance.  Respiratory: Negative for cough and chest tightness.   Gastrointestinal: Positive for abdominal pain. Negative for nausea.  Genitourinary: Negative for difficulty urinating, frequency and vaginal pain.  Musculoskeletal: Negative for back pain and gait problem.  Skin: Negative for pallor and rash.  Neurological: Negative for dizziness, tremors, weakness, numbness and headaches.  Psychiatric/Behavioral: Negative for confusion, sleep disturbance and suicidal ideas.    Objective:  BP 134/66 (BP Location: Left Arm, Patient Position: Sitting, Cuff Size: Normal)   Pulse 74   Temp 98.9 F  (37.2 C) (Oral)   Ht 5\' 2"  (1.575 m)   Wt 103 lb (46.7 kg)   SpO2 98%   BMI 18.84 kg/m   BP Readings from Last 3 Encounters:  12/26/17 134/66  08/26/17 132/64  05/15/17 138/64    Wt Readings from Last 3 Encounters:  12/26/17 103 lb (46.7 kg)  08/26/17 102 lb (46.3 kg)  05/15/17 99 lb (44.9 kg)    Physical Exam  Constitutional: She appears well-developed. No distress.  HENT:  Head: Normocephalic.  Right Ear: External ear normal.  Left Ear: External ear normal.  Nose: Nose normal.  Mouth/Throat: Oropharynx is clear and moist.  Eyes: Pupils are equal, round, and reactive to light. Conjunctivae are normal. Right eye exhibits no discharge. Left eye exhibits no discharge.  Neck: Normal range of motion. Neck supple. No JVD present. No tracheal deviation present. No thyromegaly present.  Cardiovascular: Normal rate, regular rhythm and normal heart sounds.  Pulmonary/Chest: No stridor. No respiratory distress. She has no wheezes.  Abdominal: Soft. Bowel sounds are normal. She exhibits no distension and no mass. There is no tenderness. There is no rebound and no guarding.  Musculoskeletal: She exhibits no edema or tenderness.  Lymphadenopathy:    She has no cervical adenopathy.  Neurological: She displays normal reflexes. No cranial nerve deficit. She exhibits normal muscle tone. Coordination normal.  Skin: No rash noted. No erythema.  Psychiatric: She has a normal mood and affect. Her behavior is normal. Judgment and  thought content normal.   B bruit  Epig sensitive  A/o/c   Lab Results  Component Value Date   WBC 5.8 05/15/2017   HGB 13.8 05/15/2017   HCT 41.2 05/15/2017   PLT 300.0 05/15/2017   GLUCOSE 99 05/15/2017   ALT 15 05/15/2017   AST 14 05/15/2017   NA 132 (L) 05/15/2017   K 3.9 05/15/2017   CL 99 05/15/2017   CREATININE 0.55 05/15/2017   BUN 8 05/15/2017   CO2 26 05/15/2017   TSH 0.62 05/15/2017    Patient was never admitted.  Assessment & Plan:    There are no diagnoses linked to this encounter. I am having Amorita Nicolaisen maintain her Fish Oil, Ginkgo Biloba, Vinpocetine, Vitamin D3, b complex vitamins, aspirin EC, and amoxicillin.  No orders of the defined types were placed in this encounter.    Follow-up: No follow-ups on file.  Walker Kehr, MD

## 2017-12-26 NOTE — Assessment & Plan Note (Signed)
Duplex US

## 2017-12-29 ENCOUNTER — Other Ambulatory Visit: Payer: Self-pay | Admitting: Internal Medicine

## 2017-12-29 MED ORDER — CLARITHROMYCIN 500 MG PO TABS: 500.0000 mg | ORAL_TABLET | Freq: Two times a day (BID) | ORAL | 0 refills | Status: AC

## 2017-12-29 MED ORDER — AMOXICILLIN 500 MG PO CAPS: 1000.0000 mg | ORAL_CAPSULE | Freq: Two times a day (BID) | ORAL | 0 refills | Status: DC

## 2017-12-29 MED ORDER — PANTOPRAZOLE SODIUM 40 MG PO TBEC: 40.0000 mg | DELAYED_RELEASE_TABLET | Freq: Two times a day (BID) | ORAL | 1 refills | Status: DC

## 2017-12-30 NOTE — Addendum Note (Signed)
Addended by: Karren Cobble on: 12/30/2017 09:57 AM   Modules accepted: Orders

## 2018-01-09 ENCOUNTER — Other Ambulatory Visit: Payer: Self-pay

## 2018-01-09 DIAGNOSIS — R0989 Other specified symptoms and signs involving the circulatory and respiratory systems: Secondary | ICD-10-CM

## 2018-01-16 ENCOUNTER — Ambulatory Visit
Admission: RE | Admit: 2018-01-16 | Discharge: 2018-01-16 | Disposition: A | Discharge status: Home / Self Care | Payer: Medicare Other | Source: Ambulatory Visit | Attending: Internal Medicine | Admitting: Internal Medicine

## 2018-01-16 DIAGNOSIS — R109 Unspecified abdominal pain: Secondary | ICD-10-CM

## 2018-01-16 DIAGNOSIS — K8020 Calculus of gallbladder without cholecystitis without obstruction: Secondary | ICD-10-CM | POA: Diagnosis not present

## 2018-01-16 DIAGNOSIS — H9193 Unspecified hearing loss, bilateral: Secondary | ICD-10-CM

## 2018-01-18 ENCOUNTER — Encounter (INDEPENDENT_AMBULATORY_CARE_PROVIDER_SITE_OTHER): Payer: Self-pay

## 2018-01-26 DIAGNOSIS — L570 Actinic keratosis: Secondary | ICD-10-CM | POA: Diagnosis not present

## 2018-01-27 ENCOUNTER — Ambulatory Visit (HOSPITAL_COMMUNITY)
Admission: RE | Admit: 2018-01-27 | Discharge: 2018-01-27 | Disposition: A | Discharge status: Home / Self Care | Payer: Medicare Other | Source: Ambulatory Visit | Attending: Cardiovascular Disease | Admitting: Cardiovascular Disease

## 2018-01-27 DIAGNOSIS — R0989 Other specified symptoms and signs involving the circulatory and respiratory systems: Secondary | ICD-10-CM | POA: Insufficient documentation

## 2018-01-28 ENCOUNTER — Encounter (INDEPENDENT_AMBULATORY_CARE_PROVIDER_SITE_OTHER): Payer: Self-pay

## 2018-02-02 DIAGNOSIS — H903 Sensorineural hearing loss, bilateral: Secondary | ICD-10-CM | POA: Diagnosis not present

## 2018-02-02 DIAGNOSIS — H9113 Presbycusis, bilateral: Secondary | ICD-10-CM | POA: Diagnosis not present

## 2018-02-11 DIAGNOSIS — H01009 Unspecified blepharitis unspecified eye, unspecified eyelid: Secondary | ICD-10-CM | POA: Diagnosis not present

## 2018-02-11 DIAGNOSIS — H1013 Acute atopic conjunctivitis, bilateral: Secondary | ICD-10-CM | POA: Diagnosis not present

## 2018-02-11 DIAGNOSIS — H40023 Open angle with borderline findings, high risk, bilateral: Secondary | ICD-10-CM | POA: Diagnosis not present

## 2018-02-11 DIAGNOSIS — H268 Other specified cataract: Secondary | ICD-10-CM | POA: Diagnosis not present

## 2018-03-23 ENCOUNTER — Encounter: Payer: Self-pay | Admitting: Internal Medicine

## 2018-03-23 ENCOUNTER — Ambulatory Visit (INDEPENDENT_AMBULATORY_CARE_PROVIDER_SITE_OTHER): Payer: Medicare Other | Admitting: Internal Medicine

## 2018-03-23 DIAGNOSIS — R109 Unspecified abdominal pain: Secondary | ICD-10-CM

## 2018-03-23 DIAGNOSIS — G5701 Lesion of sciatic nerve, right lower limb: Secondary | ICD-10-CM | POA: Diagnosis not present

## 2018-03-23 DIAGNOSIS — K2980 Duodenitis without bleeding: Secondary | ICD-10-CM | POA: Diagnosis not present

## 2018-03-23 DIAGNOSIS — B9681 Helicobacter pylori [H. pylori] as the cause of diseases classified elsewhere: Secondary | ICD-10-CM | POA: Diagnosis not present

## 2018-03-23 NOTE — Assessment & Plan Note (Signed)
Treated in 2019

## 2018-03-23 NOTE — Assessment & Plan Note (Signed)
Resolved after H pylori

## 2018-03-23 NOTE — Patient Instructions (Addendum)
You can use CBD (Hemp) oil topically or by mouth for pain Tylenol as needed for pain

## 2018-03-23 NOTE — Progress Notes (Signed)
Subjective:  Patient ID: Jessica Rowland, female    DOB: [birth year]  Age: 84 y.o. MRN: [id number]  CC: No chief complaint on file.   HPI Jessica Rowland presents for H pylori, R hip pain, R leg pain f/u  Outpatient Medications Prior to Visit  Medication Sig Dispense Refill  . aspirin EC 81 MG tablet Take 81 mg by mouth daily.    Marland Kitchen b complex vitamins tablet Take 1 tablet by mouth daily. 100 tablet 3  . Cholecalciferol (VITAMIN D3) 2000 units capsule Take 1 capsule (2,000 Units total) by mouth daily. 100 capsule 3  . Ginkgo Biloba 120 MG CAPS Take 120 mg by mouth 3 (three) times daily.    . Omega-3 Fatty Acids (FISH OIL) 1000 MG CAPS Take 1,000 mg by mouth 2 (two) times daily.    . pantoprazole (PROTONIX) 40 MG tablet Take 1 tablet (40 mg total) by mouth 2 (two) times daily. 60 tablet 1  . Vinpocetine POWD 30 mg by Does not apply route 3 (three) times daily.    Marland Kitchen amoxicillin (AMOXIL) 500 MG capsule Take 2 capsules (1,000 mg total) by mouth 2 (two) times daily. 40 capsule 0   No facility-administered medications prior to visit.     ROS: Review of Systems  Constitutional: Negative for activity change, appetite change, chills, fatigue and unexpected weight change.  HENT: Negative for congestion, mouth sores and sinus pressure.   Eyes: Negative for visual disturbance.  Respiratory: Negative for cough and chest tightness.   Gastrointestinal: Negative for abdominal pain and nausea.  Genitourinary: Negative for difficulty urinating, frequency and vaginal pain.  Musculoskeletal: Positive for arthralgias and back pain. Negative for gait problem.  Skin: Negative for pallor and rash.  Neurological: Negative for dizziness, tremors, weakness, numbness and headaches.  Psychiatric/Behavioral: Negative for confusion and sleep disturbance.    Objective:  BP 132/74 (BP Location: Left Arm, Patient Position: Sitting, Cuff Size: Normal)   Pulse (!) 58   Temp 98 F (36.7 C) (Oral)   Ht 5\' 2"   (1.575 m)   Wt 100 lb (45.4 kg)   SpO2 97%   BMI 18.29 kg/m   BP Readings from Last 3 Encounters:  03/23/18 132/74  12/26/17 134/66  08/26/17 132/64    Wt Readings from Last 3 Encounters:  03/23/18 100 lb (45.4 kg)  12/26/17 103 lb (46.7 kg)  08/26/17 102 lb (46.3 kg)    Physical Exam  Constitutional: She appears well-developed. No distress.  HENT:  Head: Normocephalic.  Right Ear: External ear normal.  Left Ear: External ear normal.  Nose: Nose normal.  Mouth/Throat: Oropharynx is clear and moist.  Eyes: Pupils are equal, round, and reactive to light. Conjunctivae are normal. Right eye exhibits no discharge. Left eye exhibits no discharge.  Neck: Normal range of motion. Neck supple. No JVD present. No tracheal deviation present. No thyromegaly present.  Cardiovascular: Normal rate, regular rhythm and normal heart sounds.  Pulmonary/Chest: No stridor. No respiratory distress. She has no wheezes.  Abdominal: Soft. Bowel sounds are normal. She exhibits no distension and no mass. There is no tenderness. There is no rebound and no guarding.  Musculoskeletal: She exhibits tenderness. She exhibits no edema.  Lymphadenopathy:    She has no cervical adenopathy.  Neurological: She displays normal reflexes. No cranial nerve deficit. She exhibits normal muscle tone. Coordination normal.  Skin: No rash noted. No erythema.  Psychiatric: She has a normal mood and affect. Her behavior is normal. Judgment and thought content normal.  R hip tender w/ROM  Lab Results  Component Value Date   WBC 8.4 12/26/2017   HGB 13.7 12/26/2017   HCT 39.9 12/26/2017   PLT 279.0 12/26/2017   GLUCOSE 100 (H) 12/26/2017   ALT 17 12/26/2017   AST 15 12/26/2017   NA 130 (L) 12/26/2017   K 4.6 12/26/2017   CL 96 12/26/2017   CREATININE 0.54 12/26/2017   BUN 12 12/26/2017   CO2 26 12/26/2017   TSH 1.23 12/26/2017    No results found.  Assessment & Plan:   There are no diagnoses linked to this  encounter.   No orders of the defined types were placed in this encounter.    Follow-up: No follow-ups on file.  Walker Kehr, MD

## 2018-03-23 NOTE — Assessment & Plan Note (Signed)
Home exercises OK CBD oil

## 2018-05-18 DIAGNOSIS — Z23 Encounter for immunization: Secondary | ICD-10-CM | POA: Diagnosis not present

## 2018-05-22 DIAGNOSIS — H268 Other specified cataract: Secondary | ICD-10-CM | POA: Diagnosis not present

## 2018-05-22 DIAGNOSIS — H353132 Nonexudative age-related macular degeneration, bilateral, intermediate dry stage: Secondary | ICD-10-CM | POA: Diagnosis not present

## 2018-05-22 DIAGNOSIS — H35033 Hypertensive retinopathy, bilateral: Secondary | ICD-10-CM | POA: Diagnosis not present

## 2018-05-22 DIAGNOSIS — H40023 Open angle with borderline findings, high risk, bilateral: Secondary | ICD-10-CM | POA: Diagnosis not present

## 2018-08-03 ENCOUNTER — Ambulatory Visit: Payer: Medicare Other | Admitting: Internal Medicine

## 2018-08-24 ENCOUNTER — Encounter: Payer: Self-pay | Admitting: Internal Medicine

## 2018-08-24 ENCOUNTER — Ambulatory Visit (INDEPENDENT_AMBULATORY_CARE_PROVIDER_SITE_OTHER): Payer: Medicare Other | Admitting: Internal Medicine

## 2018-08-24 ENCOUNTER — Other Ambulatory Visit (INDEPENDENT_AMBULATORY_CARE_PROVIDER_SITE_OTHER): Payer: Medicare Other

## 2018-08-24 VITALS — BP 126/58 | HR 69 | Temp 98.0°F | Ht 62.0 in | Wt 105.0 lb

## 2018-08-24 DIAGNOSIS — B009 Herpesviral infection, unspecified: Secondary | ICD-10-CM | POA: Insufficient documentation

## 2018-08-24 DIAGNOSIS — R011 Cardiac murmur, unspecified: Secondary | ICD-10-CM | POA: Diagnosis not present

## 2018-08-24 DIAGNOSIS — R0600 Dyspnea, unspecified: Secondary | ICD-10-CM

## 2018-08-24 DIAGNOSIS — Z23 Encounter for immunization: Secondary | ICD-10-CM

## 2018-08-24 DIAGNOSIS — G5701 Lesion of sciatic nerve, right lower limb: Secondary | ICD-10-CM | POA: Diagnosis not present

## 2018-08-24 DIAGNOSIS — R0609 Other forms of dyspnea: Secondary | ICD-10-CM | POA: Insufficient documentation

## 2018-08-24 LAB — CBC WITH DIFFERENTIAL/PLATELET
BASOS PCT: 0.5 % (ref 0.0–3.0)
Basophils Absolute: 0 10*3/uL (ref 0.0–0.1)
EOS ABS: 0 10*3/uL (ref 0.0–0.7)
EOS PCT: 0.5 % (ref 0.0–5.0)
HEMATOCRIT: 40.3 % (ref 36.0–46.0)
HEMOGLOBIN: 13.7 g/dL (ref 12.0–15.0)
LYMPHS PCT: 24 % (ref 12.0–46.0)
Lymphs Abs: 2.1 10*3/uL (ref 0.7–4.0)
MCHC: 34 g/dL (ref 30.0–36.0)
MCV: 89.2 fl (ref 78.0–100.0)
Monocytes Absolute: 0.5 10*3/uL (ref 0.1–1.0)
Monocytes Relative: 5.8 % (ref 3.0–12.0)
Neutro Abs: 5.9 10*3/uL (ref 1.4–7.7)
Neutrophils Relative %: 69.2 % (ref 43.0–77.0)
Platelets: 263 10*3/uL (ref 150.0–400.0)
RBC: 4.52 Mil/uL (ref 3.87–5.11)
RDW: 13.2 % (ref 11.5–15.5)
WBC: 8.5 10*3/uL (ref 4.0–10.5)

## 2018-08-24 LAB — BASIC METABOLIC PANEL
BUN: 14 mg/dL (ref 6–23)
CHLORIDE: 98 meq/L (ref 96–112)
CO2: 24 mEq/L (ref 19–32)
Calcium: 9.1 mg/dL (ref 8.4–10.5)
Creatinine, Ser: 0.77 mg/dL (ref 0.40–1.20)
GFR: 75.89 mL/min (ref >60.00)
Glucose, Bld: 102 mg/dL — ABNORMAL HIGH (ref 70–99)
POTASSIUM: 4.2 meq/L (ref 3.5–5.1)
Sodium: 130 mEq/L — ABNORMAL LOW (ref 135–145)

## 2018-08-24 LAB — HEPATIC FUNCTION PANEL
ALBUMIN: 4.2 g/dL (ref 3.5–5.2)
ALT: 16 U/L (ref 0–35)
AST: 16 U/L (ref 0–37)
Alkaline Phosphatase: 54 U/L (ref 39–117)
BILIRUBIN DIRECT: 0.1 mg/dL (ref 0.0–0.3)
TOTAL PROTEIN: 7 g/dL (ref 6.0–8.3)
Total Bilirubin: 0.4 mg/dL (ref 0.2–1.2)

## 2018-08-24 MED ORDER — ACYCLOVIR 400 MG PO TABS: 400.0000 mg | ORAL_TABLET | Freq: Three times a day (TID) | ORAL | 2 refills | Status: DC

## 2018-08-24 NOTE — Assessment & Plan Note (Signed)
Try trekking poles 

## 2018-08-24 NOTE — Assessment & Plan Note (Signed)
Mild No CP  CT ca scoring test option suggested

## 2018-08-24 NOTE — Assessment & Plan Note (Signed)
Acyclovir 

## 2018-08-24 NOTE — Patient Instructions (Addendum)
Try trekking poles at the store  Cardiac CT calcium scoring test $150   Computed tomography, more commonly known as a CT or CAT scan, is a diagnostic medical imaging test. Like traditional x-rays, it produces multiple images or pictures of the inside of the body. The cross-sectional images generated during a CT scan can be reformatted in multiple planes. They can even generate three-dimensional images. These images can be viewed on a computer monitor, printed on film or by a 3D printer, or transferred to a CD or DVD. CT images of internal organs, bones, soft tissue and blood vessels provide greater detail than traditional x-rays, particularly of soft tissues and blood vessels. A cardiac CT scan for coronary calcium is a non-invasive way of obtaining information about the presence, location and extent of calcified plaque in the coronary arteries-the vessels that supply oxygen-containing blood to the heart muscle. Calcified plaque results when there is a build-up of fat and other substances under the inner layer of the artery. This material can calcify which signals the presence of atherosclerosis, a disease of the vessel wall, also called coronary artery disease (CAD). People with this disease have an increased risk for heart attacks. In addition, over time, progression of plaque build up (CAD) can narrow the arteries or even close off blood flow to the heart. The result may be chest pain, sometimes called "angina," or a heart attack. Because calcium is a marker of CAD, the amount of calcium detected on a cardiac CT scan is a helpful prognostic tool. The findings on cardiac CT are expressed as a calcium score. Another name for this test is coronary artery calcium scoring.  What are some common uses of the procedure? The goal of cardiac CT scan for calcium scoring is to determine if CAD is present and to what extent, even if there are no symptoms. It is a screening study that may be recommended by a  physician for patients with risk factors for CAD but no clinical symptoms. The major risk factors for CAD are: . high blood cholesterol levels  . family history of heart attacks  . diabetes  . high blood pressure  . cigarette smoking  . overweight or obese  . physical inactivity   A negative cardiac CT scan for calcium scoring shows no calcification within the coronary arteries. This suggests that CAD is absent or so minimal it cannot be seen by this technique. The chance of having a heart attack over the next two to five years is very low under these circumstances. A positive test means that CAD is present, regardless of whether or not the patient is experiencing any symptoms. The amount of calcification-expressed as the calcium score-may help to predict the likelihood of a myocardial infarction (heart attack) in the coming years and helps your medical doctor or cardiologist decide whether the patient may need to take preventive medicine or undertake other measures such as diet and exercise to lower the risk for heart attack. The extent of CAD is graded according to your calcium score:  Calcium Score  Presence of CAD  0 No evidence of CAD   1-10 Minimal evidence of CAD  11-100 Mild evidence of CAD  101-400 Moderate evidence of CAD  Over 400 Extensive evidence of CAD

## 2018-08-24 NOTE — Assessment & Plan Note (Addendum)
2D ECHO cardiac CT scan for calcium scoring option offered

## 2018-08-24 NOTE — Progress Notes (Signed)
Subjective:  Patient ID: Jessica Rowland, female    DOB: 08/11/1932  Age: 85 y.o. MRN: [id number]  CC: No chief complaint on file.   HPI Chelsey Redondo presents for LBP, hip pain f/u C/o fatigue during long walks  Outpatient Medications Prior to Visit  Medication Sig Dispense Refill  . aspirin EC 81 MG tablet Take 81 mg by mouth daily.    Marland Kitchen b complex vitamins tablet Take 1 tablet by mouth daily. 100 tablet 3  . Cholecalciferol (VITAMIN D3) 2000 units capsule Take 1 capsule (2,000 Units total) by mouth daily. 100 capsule 3  . Ginkgo Biloba 120 MG CAPS Take 120 mg by mouth 3 (three) times daily.    . Omega-3 Fatty Acids (FISH OIL) 1000 MG CAPS Take 1,000 mg by mouth 2 (two) times daily.    . Vinpocetine POWD 30 mg by Does not apply route 3 (three) times daily.     No facility-administered medications prior to visit.     ROS: Review of Systems  Constitutional: Negative for activity change, appetite change, chills, fatigue and unexpected weight change.  HENT: Negative for congestion, mouth sores and sinus pressure.   Eyes: Negative for visual disturbance.  Respiratory: Negative for cough and chest tightness.   Gastrointestinal: Negative for abdominal pain and nausea.  Genitourinary: Negative for difficulty urinating, frequency and vaginal pain.  Musculoskeletal: Positive for arthralgias and back pain. Negative for gait problem.  Skin: Negative for pallor and rash.  Neurological: Negative for dizziness, tremors, weakness, numbness and headaches.  Psychiatric/Behavioral: Negative for confusion and sleep disturbance.    Objective:  BP (!) 126/58 (BP Location: Left Arm, Patient Position: Sitting, Cuff Size: Normal)   Pulse 69   Temp 98 F (36.7 C) (Oral)   Ht 5\' 2"  (1.575 m)   Wt 105 lb (47.6 kg)   SpO2 97%   BMI 19.20 kg/m   BP Readings from Last 3 Encounters:  08/24/18 (!) 126/58  03/23/18 132/74  12/26/17 134/66    Wt Readings from Last 3 Encounters:  08/24/18 105  lb (47.6 kg)  03/23/18 100 lb (45.4 kg)  12/26/17 103 lb (46.7 kg)    Physical Exam Constitutional:      General: She is not in acute distress.    Appearance: She is well-developed.  HENT:     Head: Normocephalic.     Right Ear: External ear normal.     Left Ear: External ear normal.     Nose: Nose normal.  Eyes:     General:        Right eye: No discharge.        Left eye: No discharge.     Conjunctiva/sclera: Conjunctivae normal.     Pupils: Pupils are equal, round, and reactive to light.  Neck:     Musculoskeletal: Normal range of motion and neck supple.     Thyroid: No thyromegaly.     Vascular: No JVD.     Trachea: No tracheal deviation.  Cardiovascular:     Rate and Rhythm: Normal rate and regular rhythm.     Heart sounds: Normal heart sounds.  Pulmonary:     Effort: No respiratory distress.     Breath sounds: No stridor. No wheezing.  Abdominal:     General: Bowel sounds are normal. There is no distension.     Palpations: Abdomen is soft. There is no mass.     Tenderness: There is no abdominal tenderness. There is no guarding or rebound.  Musculoskeletal:  General: No tenderness.  Lymphadenopathy:     Cervical: No cervical adenopathy.  Skin:    Findings: No erythema or rash.  Neurological:     Cranial Nerves: No cranial nerve deficit.     Motor: No abnormal muscle tone.     Coordination: Coordination normal.     Deep Tendon Reflexes: Reflexes normal.  Psychiatric:        Behavior: Behavior normal.        Thought Content: Thought content normal.        Judgment: Judgment normal.   heart murmur  Lab Results  Component Value Date   WBC 8.4 12/26/2017   HGB 13.7 12/26/2017   HCT 39.9 12/26/2017   PLT 279.0 12/26/2017   GLUCOSE 100 (H) 12/26/2017   ALT 17 12/26/2017   AST 15 12/26/2017   NA 130 (L) 12/26/2017   K 4.6 12/26/2017   CL 96 12/26/2017   CREATININE 0.54 12/26/2017   BUN 12 12/26/2017   CO2 26 12/26/2017   TSH 1.23 12/26/2017     Vas US Carotid  Result Date: 01/28/2018 Carotid Arterial Duplex Study Indications:       Bruit. Risk Factors:      No history of smoking. Comparison Study:  No previous studies available for comparison Performing Technologist: Chesley Noon RVT  Examination Guidelines: A complete evaluation includes B-mode imaging, spectral Doppler, color Doppler, and power Doppler as needed of all accessible portions of each vessel. Bilateral testing is considered an integral part of a complete examination. Limited examinations for reoccurring indications may be performed as noted.  Right Carotid Findings: +----------+--------+--------+--------+--------+------------------+           PSV cm/sEDV cm/sStenosisDescribeComments           +----------+--------+--------+--------+--------+------------------+ CCA Prox  116     0                                          +----------+--------+--------+--------+--------+------------------+ CCA Distal51      0                       intimal thickening +----------+--------+--------+--------+--------+------------------+ ICA Prox  47      6                       intimal thickening +----------+--------+--------+--------+--------+------------------+ ICA Mid   78      10      Normal                             +----------+--------+--------+--------+--------+------------------+ ICA Distal66      10                                         +----------+--------+--------+--------+--------+------------------+ ECA       88      0                                          +----------+--------+--------+--------+--------+------------------+ +----------+--------+-------+----------------+-------------------+           PSV cm/sEDV cmsDescribe        Arm Pressure (mmHG) +----------+--------+-------+----------------+-------------------+ [id number]  Multiphasic, [id number]                  +----------+--------+-------+----------------+-------------------+ +---------+--------+--+--------+-+---------+ VertebralPSV cm/s40EDV cm/s6Antegrade +---------+--------+--+--------+-+---------+  Left Carotid Findings: +----------+--------+--------+--------+--------+------------------+           PSV cm/sEDV cm/sStenosisDescribeComments           +----------+--------+--------+--------+--------+------------------+ CCA Prox  81      1                                          +----------+--------+--------+--------+--------+------------------+ CCA Distal71      9                       intimal thickening +----------+--------+--------+--------+--------+------------------+ ICA Prox  43      9                                          +----------+--------+--------+--------+--------+------------------+ ICA Mid   96      15      Normal                             +----------+--------+--------+--------+--------+------------------+ ICA Distal34      5                       tortuous           +----------+--------+--------+--------+--------+------------------+ ECA       59      0                                          +----------+--------+--------+--------+--------+------------------+ +----------+--------+--------+----------------+-------------------+ SubclavianPSV cm/sEDV cm/sDescribe        Arm Pressure (mmHG) +----------+--------+--------+----------------+-------------------+           91              Multiphasic, [id number]                 +----------+--------+--------+----------------+-------------------+ +---------+--------+--+--------+-+---------+ VertebralPSV cm/s49EDV cm/s5Antegrade +---------+--------+--+--------+-+---------+  Final Interpretation: Right Carotid: The extracranial vessels were near-normal with only minimal wall                thickening or plaque. Left Carotid: The extracranial vessels were near-normal with only minimal wall                thickening or plaque. Vertebrals:  Bilateral vertebral arteries demonstrate antegrade flow. Subclavians: Normal flow hemodynamics were seen in bilateral subclavian              arteries. *See table(s) above for measurements and observations.  Electronically signed by Jenkins Rouge MD on 01/28/2018 at 10:59:45 AM.    Final     Assessment & Plan:   There are no diagnoses linked to this encounter.   No orders of the defined types were placed in this encounter.    Follow-up: No follow-ups on file.  Walker Kehr, MD

## 2018-09-24 ENCOUNTER — Ambulatory Visit (HOSPITAL_COMMUNITY): Payer: Medicare Other | Attending: Cardiology

## 2018-09-24 DIAGNOSIS — R0600 Dyspnea, unspecified: Secondary | ICD-10-CM | POA: Diagnosis not present

## 2018-10-17 DIAGNOSIS — S6292XA Unspecified fracture of left wrist and hand, initial encounter for closed fracture: Secondary | ICD-10-CM | POA: Diagnosis not present

## 2018-10-17 DIAGNOSIS — M25532 Pain in left wrist: Secondary | ICD-10-CM | POA: Diagnosis not present

## 2018-10-18 DIAGNOSIS — S52502A Unspecified fracture of the lower end of left radius, initial encounter for closed fracture: Secondary | ICD-10-CM | POA: Diagnosis not present

## 2018-10-29 ENCOUNTER — Telehealth: Payer: Self-pay | Admitting: Internal Medicine

## 2018-10-29 DIAGNOSIS — S52502D Unspecified fracture of the lower end of left radius, subsequent encounter for closed fracture with routine healing: Secondary | ICD-10-CM | POA: Diagnosis not present

## 2018-10-29 NOTE — Telephone Encounter (Signed)
Copied from [place] [id number]. Topic: Quick Communication - See Telephone Encounter >> Oct 29, 2018 12:00 PM Burchel, Abbi R wrote: CRM for notification. See Telephone encounter for: 10/29/18.  Pt's daughter Elray Mcgregor) requesting referral to [facility] for broken wrist.  Pt had fall aprox 10 days ago.   Natalia: [phone]

## 2018-10-30 NOTE — Telephone Encounter (Signed)
Pt's daughter, Jessica Rowland called back and said she contacted [employer] - they advised her that April 1st , Dr Alain Marion should be able to place it then - community health will come off the card by April 1st. Can the referral be placed on April 1st.

## 2018-10-30 NOTE — Telephone Encounter (Signed)
Spoke with ortho and they need the referral from who is on patients medicaid card which is not our office. pts daughter notified and will contact that office to get referral

## 2018-11-05 DIAGNOSIS — S52502D Unspecified fracture of the lower end of left radius, subsequent encounter for closed fracture with routine healing: Secondary | ICD-10-CM | POA: Diagnosis not present

## 2018-11-05 NOTE — Telephone Encounter (Signed)
LM notifying daughter that we are not contracted with medicaid, we can do a paper referral but that does not mean they will accept it.

## 2018-11-05 NOTE — Telephone Encounter (Signed)
Will try paper referral after 1st

## 2018-11-16 DIAGNOSIS — S52502D Unspecified fracture of the lower end of left radius, subsequent encounter for closed fracture with routine healing: Secondary | ICD-10-CM | POA: Diagnosis not present

## 2018-11-24 ENCOUNTER — Ambulatory Visit: Payer: Medicare Other | Admitting: Internal Medicine

## 2018-12-01 DIAGNOSIS — S52502D Unspecified fracture of the lower end of left radius, subsequent encounter for closed fracture with routine healing: Secondary | ICD-10-CM | POA: Diagnosis not present

## 2018-12-15 DIAGNOSIS — S52502D Unspecified fracture of the lower end of left radius, subsequent encounter for closed fracture with routine healing: Secondary | ICD-10-CM | POA: Diagnosis not present

## 2018-12-29 DIAGNOSIS — S52502D Unspecified fracture of the lower end of left radius, subsequent encounter for closed fracture with routine healing: Secondary | ICD-10-CM | POA: Diagnosis not present

## 2019-01-22 ENCOUNTER — Ambulatory Visit (INDEPENDENT_AMBULATORY_CARE_PROVIDER_SITE_OTHER): Payer: Medicare Other | Admitting: Internal Medicine

## 2019-01-22 ENCOUNTER — Other Ambulatory Visit: Payer: Self-pay

## 2019-01-22 ENCOUNTER — Encounter: Payer: Self-pay | Admitting: Internal Medicine

## 2019-01-22 ENCOUNTER — Other Ambulatory Visit (INDEPENDENT_AMBULATORY_CARE_PROVIDER_SITE_OTHER): Payer: Medicare Other

## 2019-01-22 ENCOUNTER — Ambulatory Visit (INDEPENDENT_AMBULATORY_CARE_PROVIDER_SITE_OTHER)
Admission: RE | Admit: 2019-01-22 | Discharge: 2019-01-22 | Disposition: A | Discharge status: Home / Self Care | Payer: Medicare Other | Source: Ambulatory Visit | Attending: Internal Medicine | Admitting: Internal Medicine

## 2019-01-22 VITALS — BP 122/64 | HR 87 | Temp 98.0°F | Ht 62.0 in | Wt 101.0 lb

## 2019-01-22 DIAGNOSIS — R0600 Dyspnea, unspecified: Secondary | ICD-10-CM

## 2019-01-22 DIAGNOSIS — R0602 Shortness of breath: Secondary | ICD-10-CM | POA: Diagnosis not present

## 2019-01-22 DIAGNOSIS — M5441 Lumbago with sciatica, right side: Secondary | ICD-10-CM | POA: Diagnosis not present

## 2019-01-22 DIAGNOSIS — E559 Vitamin D deficiency, unspecified: Secondary | ICD-10-CM | POA: Diagnosis not present

## 2019-01-22 DIAGNOSIS — G3281 Cerebellar ataxia in diseases classified elsewhere: Secondary | ICD-10-CM

## 2019-01-22 DIAGNOSIS — R269 Unspecified abnormalities of gait and mobility: Secondary | ICD-10-CM | POA: Insufficient documentation

## 2019-01-22 DIAGNOSIS — G5701 Lesion of sciatic nerve, right lower limb: Secondary | ICD-10-CM

## 2019-01-22 DIAGNOSIS — R5383 Other fatigue: Secondary | ICD-10-CM | POA: Insufficient documentation

## 2019-01-22 DIAGNOSIS — R109 Unspecified abdominal pain: Secondary | ICD-10-CM

## 2019-01-22 DIAGNOSIS — K811 Chronic cholecystitis: Secondary | ICD-10-CM | POA: Diagnosis not present

## 2019-01-22 LAB — BASIC METABOLIC PANEL
BUN: 14 mg/dL (ref 6–23)
CO2: 26 mEq/L (ref 19–32)
Calcium: 9.4 mg/dL (ref 8.4–10.5)
Chloride: 98 mEq/L (ref 96–112)
Creatinine, Ser: 0.6 mg/dL (ref 0.40–1.20)
GFR: 95.13 mL/min (ref >60.00)
Glucose, Bld: 111 mg/dL — ABNORMAL HIGH (ref 70–99)
Potassium: 4.4 mEq/L (ref 3.5–5.1)
Sodium: 133 mEq/L — ABNORMAL LOW (ref 135–145)

## 2019-01-22 LAB — CBC WITH DIFFERENTIAL/PLATELET
Basophils Absolute: 0 10*3/uL (ref 0.0–0.1)
Basophils Relative: 0.5 % (ref 0.0–3.0)
Eosinophils Absolute: 0.1 10*3/uL (ref 0.0–0.7)
Eosinophils Relative: 0.7 % (ref 0.0–5.0)
HCT: 43.2 % (ref 36.0–46.0)
Hemoglobin: 14.6 g/dL (ref 12.0–15.0)
Lymphocytes Relative: 25.4 % (ref 12.0–46.0)
Lymphs Abs: 2.1 10*3/uL (ref 0.7–4.0)
MCHC: 33.7 g/dL (ref 30.0–36.0)
MCV: 89.4 fl (ref 78.0–100.0)
Monocytes Absolute: 0.5 10*3/uL (ref 0.1–1.0)
Monocytes Relative: 6.2 % (ref 3.0–12.0)
Neutro Abs: 5.5 10*3/uL (ref 1.4–7.7)
Neutrophils Relative %: 67.2 % (ref 43.0–77.0)
Platelets: 275 10*3/uL (ref 150.0–400.0)
RBC: 4.83 Mil/uL (ref 3.87–5.11)
RDW: 13.1 % (ref 11.5–15.5)
WBC: 8.3 10*3/uL (ref 4.0–10.5)

## 2019-01-22 LAB — TSH: TSH: 1.21 u[IU]/mL (ref 0.35–4.50)

## 2019-01-22 LAB — URINALYSIS
Bilirubin Urine: NEGATIVE
Hgb urine dipstick: NEGATIVE
Ketones, ur: NEGATIVE
Leukocytes,Ua: NEGATIVE
Nitrite: NEGATIVE
Specific Gravity, Urine: 1.01 (ref 1.000–1.030)
Total Protein, Urine: NEGATIVE
Urine Glucose: NEGATIVE
Urobilinogen, UA: 0.2 (ref 0.0–1.0)
pH: 6.5 (ref 5.0–8.0)

## 2019-01-22 LAB — HEPATIC FUNCTION PANEL
ALT: 16 U/L (ref 0–35)
AST: 14 U/L (ref 0–37)
Albumin: 4.3 g/dL (ref 3.5–5.2)
Alkaline Phosphatase: 52 U/L (ref 39–117)
Bilirubin, Direct: 0.1 mg/dL (ref 0.0–0.3)
Total Bilirubin: 0.7 mg/dL (ref 0.2–1.2)
Total Protein: 6.9 g/dL (ref 6.0–8.3)

## 2019-01-22 LAB — VITAMIN D 25 HYDROXY (VIT D DEFICIENCY, FRACTURES): VITD: 59.07 ng/mL (ref 30.00–100.00)

## 2019-01-22 NOTE — Assessment & Plan Note (Signed)
No abd pain

## 2019-01-22 NOTE — Assessment & Plan Note (Addendum)
  2020 multifactorial fell on 10/18/18 - L wrist fx Cane or trekking poles  Drink more water CT head Labs Refused PT at home

## 2019-01-22 NOTE — Assessment & Plan Note (Signed)
No relapse clinically

## 2019-01-22 NOTE — Assessment & Plan Note (Signed)
labs

## 2019-01-22 NOTE — Assessment & Plan Note (Signed)
Cont w/chronic pain management, stretching

## 2019-01-22 NOTE — Patient Instructions (Signed)
Trekking poles or cane Shorter walks

## 2019-01-22 NOTE — Progress Notes (Signed)
Subjective:  Patient ID: Jessica Rowland, female    DOB: [birth year]  Age: 85 y.o. MRN: [id number]  CC: No chief complaint on file.   HPI Jessica Rowland presents for hearing loss, hip pain, gait disorder C/o fatigue, weakness Walks 20 min a day - it makes her gait unstable after 20 min - fell on 10/18/18 - L wrist fx  Outpatient Medications Prior to Visit  Medication Sig Dispense Refill   acyclovir (ZOVIRAX) 400 MG tablet Take 1 tablet (400 mg total) by mouth 3 (three) times daily. 21 tablet 2   aspirin EC 81 MG tablet Take 81 mg by mouth daily.     b complex vitamins tablet Take 1 tablet by mouth daily. 100 tablet 3   Cholecalciferol (VITAMIN D3) 2000 units capsule Take 1 capsule (2,000 Units total) by mouth daily. 100 capsule 3   Ginkgo Biloba 120 MG CAPS Take 120 mg by mouth 3 (three) times daily.     Omega-3 Fatty Acids (FISH OIL) 1000 MG CAPS Take 1,000 mg by mouth 2 (two) times daily.     Vinpocetine POWD 30 mg by Does not apply route 3 (three) times daily.     No facility-administered medications prior to visit.     ROS: Review of Systems  Constitutional: Positive for fatigue. Negative for activity change, appetite change, chills and unexpected weight change.  HENT: Negative for congestion, mouth sores and sinus pressure.   Eyes: Negative for visual disturbance.  Respiratory: Positive for shortness of breath. Negative for cough and chest tightness.   Gastrointestinal: Negative for abdominal pain and nausea.  Genitourinary: Negative for difficulty urinating, frequency and vaginal pain.  Musculoskeletal: Positive for arthralgias and gait problem. Negative for back pain.  Skin: Negative for pallor and rash.  Neurological: Negative for dizziness, tremors, weakness, numbness and headaches.  Psychiatric/Behavioral: Negative for confusion, sleep disturbance and suicidal ideas.    Objective:  There were no vitals taken for this visit.  BP Readings from Last 3  Encounters:  08/24/18 (!) 126/58  03/23/18 132/74  12/26/17 134/66    Wt Readings from Last 3 Encounters:  08/24/18 105 lb (47.6 kg)  03/23/18 100 lb (45.4 kg)  12/26/17 103 lb (46.7 kg)    Physical Exam Constitutional:      General: She is not in acute distress.    Appearance: She is well-developed.  HENT:     Head: Normocephalic.     Right Ear: External ear normal.     Left Ear: External ear normal.     Nose: Nose normal.  Eyes:     General:        Right eye: No discharge.        Left eye: No discharge.     Conjunctiva/sclera: Conjunctivae normal.     Pupils: Pupils are equal, round, and reactive to light.  Neck:     Musculoskeletal: Normal range of motion and neck supple.     Thyroid: No thyromegaly.     Vascular: No JVD.     Trachea: No tracheal deviation.  Cardiovascular:     Rate and Rhythm: Normal rate and regular rhythm.     Heart sounds: Normal heart sounds.  Pulmonary:     Effort: No respiratory distress.     Breath sounds: No stridor. No wheezing.  Abdominal:     General: Bowel sounds are normal. There is no distension.     Palpations: Abdomen is soft. There is no mass.     Tenderness: There  is no abdominal tenderness. There is no guarding or rebound.  Musculoskeletal:        General: No tenderness.  Lymphadenopathy:     Cervical: No cervical adenopathy.  Skin:    Findings: No erythema or rash.  Neurological:     General: No focal deficit present.     Mental Status: She is oriented to person, place, and time.     Cranial Nerves: No cranial nerve deficit.     Sensory: No sensory deficit.     Motor: No weakness or abnormal muscle tone.     Coordination: Coordination abnormal.     Gait: Gait abnormal.     Deep Tendon Reflexes: Reflexes normal.  Psychiatric:        Mood and Affect: Mood normal.        Behavior: Behavior normal.        Thought Content: Thought content normal.        Judgment: Judgment normal.   L wrist  w/swelling Ataxic Thin  Lab Results  Component Value Date   WBC 8.5 08/24/2018   HGB 13.7 08/24/2018   HCT 40.3 08/24/2018   PLT 263.0 08/24/2018   GLUCOSE 102 (H) 08/24/2018   ALT 16 08/24/2018   AST 16 08/24/2018   NA 130 (L) 08/24/2018   K 4.2 08/24/2018   CL 98 08/24/2018   CREATININE 0.77 08/24/2018   BUN 14 08/24/2018   CO2 24 08/24/2018   TSH 1.23 12/26/2017    Vas US Carotid  Result Date: 01/28/2018 Carotid Arterial Duplex Study Indications:       Bruit. Risk Factors:      No history of smoking. Comparison Study:  No previous studies available for comparison Performing Technologist: Chesley Noon RVT  Examination Guidelines: A complete evaluation includes B-mode imaging, spectral Doppler, color Doppler, and power Doppler as needed of all accessible portions of each vessel. Bilateral testing is considered an integral part of a complete examination. Limited examinations for reoccurring indications may be performed as noted.  Right Carotid Findings: +----------+--------+--------+--------+--------+------------------+             PSV cm/s EDV cm/s Stenosis Describe Comments            +----------+--------+--------+--------+--------+------------------+  CCA Prox   116      0                                              +----------+--------+--------+--------+--------+------------------+  CCA Distal 51       0                          intimal thickening  +----------+--------+--------+--------+--------+------------------+  ICA Prox   47       6                          intimal thickening  +----------+--------+--------+--------+--------+------------------+  ICA Mid    78       10       Normal                                +----------+--------+--------+--------+--------+------------------+  ICA Distal 66       10                                             +----------+--------+--------+--------+--------+------------------+  ECA        88       0                                               +----------+--------+--------+--------+--------+------------------+ +----------+--------+-------+----------------+-------------------+             PSV cm/s EDV cms Describe         Arm Pressure (mmHG)  +----------+--------+-------+----------------+-------------------+  Subclavian 161              Multiphasic, WNL 130                  +----------+--------+-------+----------------+-------------------+ +---------+--------+--+--------+-+---------+  Vertebral PSV cm/s 40 EDV cm/s 6 Antegrade  +---------+--------+--+--------+-+---------+  Left Carotid Findings: +----------+--------+--------+--------+--------+------------------+             PSV cm/s EDV cm/s Stenosis Describe Comments            +----------+--------+--------+--------+--------+------------------+  CCA Prox   81       1                                              +----------+--------+--------+--------+--------+------------------+  CCA Distal 71       9                          intimal thickening  +----------+--------+--------+--------+--------+------------------+  ICA Prox   43       9                                              +----------+--------+--------+--------+--------+------------------+  ICA Mid    96       15       Normal                                +----------+--------+--------+--------+--------+------------------+  ICA Distal 34       5                          tortuous            +----------+--------+--------+--------+--------+------------------+  ECA        59       0                                              +----------+--------+--------+--------+--------+------------------+ +----------+--------+--------+----------------+-------------------+  Subclavian PSV cm/s EDV cm/s Describe         Arm Pressure (mmHG)  +----------+--------+--------+----------------+-------------------+             91                Multiphasic, WNL 134                  +----------+--------+--------+----------------+-------------------+  +---------+--------+--+--------+-+---------+  Vertebral PSV cm/s 49 EDV cm/s 5 Antegrade  +---------+--------+--+--------+-+---------+  Final Interpretation: Right Carotid: The extracranial vessels were near-normal with only minimal wall  thickening or plaque. Left Carotid: The extracranial vessels were near-normal with only minimal wall               thickening or plaque. Vertebrals:  Bilateral vertebral arteries demonstrate antegrade flow. Subclavians: Normal flow hemodynamics were seen in bilateral subclavian              arteries. *See table(s) above for measurements and observations.  Electronically signed by Jenkins Rouge MD on 01/28/2018 at 10:59:45 AM.    Final     Assessment & Plan:   There are no diagnoses linked to this encounter.   No orders of the defined types were placed in this encounter.    Follow-up: No follow-ups on file.  Walker Kehr, MD

## 2019-01-27 ENCOUNTER — Ambulatory Visit (INDEPENDENT_AMBULATORY_CARE_PROVIDER_SITE_OTHER)
Admission: RE | Admit: 2019-01-27 | Discharge: 2019-01-27 | Disposition: A | Discharge status: Home / Self Care | Payer: Medicare Other | Source: Ambulatory Visit | Attending: Internal Medicine | Admitting: Internal Medicine

## 2019-01-27 ENCOUNTER — Other Ambulatory Visit: Payer: Self-pay

## 2019-01-27 DIAGNOSIS — R269 Unspecified abnormalities of gait and mobility: Secondary | ICD-10-CM | POA: Diagnosis not present

## 2019-01-27 DIAGNOSIS — G3281 Cerebellar ataxia in diseases classified elsewhere: Secondary | ICD-10-CM

## 2019-02-18 ENCOUNTER — Encounter: Payer: Self-pay | Admitting: Internal Medicine

## 2019-02-18 DIAGNOSIS — H1045 Other chronic allergic conjunctivitis: Secondary | ICD-10-CM | POA: Diagnosis not present

## 2019-02-18 DIAGNOSIS — H02834 Dermatochalasis of left upper eyelid: Secondary | ICD-10-CM | POA: Diagnosis not present

## 2019-02-18 DIAGNOSIS — H02831 Dermatochalasis of right upper eyelid: Secondary | ICD-10-CM | POA: Diagnosis not present

## 2019-02-18 DIAGNOSIS — H40023 Open angle with borderline findings, high risk, bilateral: Secondary | ICD-10-CM | POA: Diagnosis not present

## 2019-02-25 ENCOUNTER — Ambulatory Visit (INDEPENDENT_AMBULATORY_CARE_PROVIDER_SITE_OTHER): Payer: Medicare Other | Admitting: Internal Medicine

## 2019-02-25 ENCOUNTER — Other Ambulatory Visit: Payer: Self-pay

## 2019-02-25 ENCOUNTER — Encounter: Payer: Self-pay | Admitting: Internal Medicine

## 2019-02-25 DIAGNOSIS — R269 Unspecified abnormalities of gait and mobility: Secondary | ICD-10-CM

## 2019-02-25 DIAGNOSIS — R5383 Other fatigue: Secondary | ICD-10-CM | POA: Diagnosis not present

## 2019-02-25 DIAGNOSIS — H9193 Unspecified hearing loss, bilateral: Secondary | ICD-10-CM

## 2019-02-25 DIAGNOSIS — R252 Cramp and spasm: Secondary | ICD-10-CM | POA: Diagnosis not present

## 2019-02-25 NOTE — Progress Notes (Signed)
Subjective:  Patient ID: Jessica Rowland, female    DOB: 1931-11-03  Age: 85 y.o. MRN: [id number]  CC: No chief complaint on file.   HPI Verlinda Slotnick presents for DOE - better w/exercise F/u osteoporosis, wrist fx. C/o cramps in legs  Outpatient Medications Prior to Visit  Medication Sig Dispense Refill  . aspirin EC 81 MG tablet Take 81 mg by mouth daily.    Marland Kitchen b complex vitamins tablet Take 1 tablet by mouth daily. 100 tablet 3  . Cholecalciferol (VITAMIN D3) 2000 units capsule Take 1 capsule (2,000 Units total) by mouth daily. 100 capsule 3  . Ginkgo Biloba 120 MG CAPS Take 120 mg by mouth 3 (three) times daily.    . Omega-3 Fatty Acids (FISH OIL) 1000 MG CAPS Take 1,000 mg by mouth 2 (two) times daily.    . Vinpocetine POWD 30 mg by Does not apply route 3 (three) times daily.     No facility-administered medications prior to visit.     ROS: Review of Systems  Constitutional: Negative for activity change, appetite change, chills, fatigue and unexpected weight change.  HENT: Negative for congestion, mouth sores and sinus pressure.   Eyes: Negative for visual disturbance.  Respiratory: Negative for cough and chest tightness.   Gastrointestinal: Negative for abdominal pain and nausea.  Genitourinary: Negative for difficulty urinating, frequency and vaginal pain.  Musculoskeletal: Negative for back pain and gait problem.  Skin: Negative for pallor and rash.  Neurological: Negative for dizziness, tremors, weakness, numbness and headaches.  Psychiatric/Behavioral: Negative for confusion, sleep disturbance and suicidal ideas.    Objective:  BP 122/66 (BP Location: Left Arm, Patient Position: Sitting, Cuff Size: Normal)   Pulse 76   Temp 98.4 F (36.9 C) (Oral)   Ht 5\' 2"  (1.575 m)   Wt 102 lb (46.3 kg)   SpO2 99%   BMI 18.66 kg/m   BP Readings from Last 3 Encounters:  02/25/19 122/66  01/22/19 122/64  08/24/18 (!) 126/58    Wt Readings from Last 3 Encounters:   02/25/19 102 lb (46.3 kg)  01/22/19 101 lb (45.8 kg)  08/24/18 105 lb (47.6 kg)    Physical Exam Constitutional:      General: She is not in acute distress.    Appearance: She is well-developed.  HENT:     Head: Normocephalic.     Right Ear: External ear normal.     Left Ear: External ear normal.     Nose: Nose normal.  Eyes:     General:        Right eye: No discharge.        Left eye: No discharge.     Conjunctiva/sclera: Conjunctivae normal.     Pupils: Pupils are equal, round, and reactive to light.  Neck:     Musculoskeletal: Normal range of motion and neck supple.     Thyroid: No thyromegaly.     Vascular: No JVD.     Trachea: No tracheal deviation.  Cardiovascular:     Rate and Rhythm: Normal rate and regular rhythm.     Heart sounds: Normal heart sounds.  Pulmonary:     Effort: No respiratory distress.     Breath sounds: No stridor. No wheezing.  Abdominal:     General: Bowel sounds are normal. There is no distension.     Palpations: Abdomen is soft. There is no mass.     Tenderness: There is no abdominal tenderness. There is no guarding or rebound.  Musculoskeletal:        General: No tenderness.  Lymphadenopathy:     Cervical: No cervical adenopathy.  Skin:    Findings: No erythema or rash.  Neurological:     Cranial Nerves: No cranial nerve deficit.     Motor: No abnormal muscle tone.     Coordination: Coordination normal.     Deep Tendon Reflexes: Reflexes normal.  Psychiatric:        Behavior: Behavior normal.        Thought Content: Thought content normal.        Judgment: Judgment normal.    L wrist sensitive  Lab Results  Component Value Date   WBC 8.3 01/22/2019   HGB 14.6 01/22/2019   HCT 43.2 01/22/2019   PLT 275.0 01/22/2019   GLUCOSE 111 (H) 01/22/2019   ALT 16 01/22/2019   AST 14 01/22/2019   NA 133 (L) 01/22/2019   K 4.4 01/22/2019   CL 98 01/22/2019   CREATININE 0.60 01/22/2019   BUN 14 01/22/2019   CO2 26 01/22/2019   TSH  1.21 01/22/2019    Ct Head Wo Contrast  Result Date: 01/27/2019 CLINICAL DATA:  Ataxia increased since falling on 10/18/2018 and fracturing wrist, cerebellar ataxia new, question stroke, history hyperlipidemia EXAM: CT HEAD WITHOUT CONTRAST TECHNIQUE: Contiguous axial images were obtained from the base of the skull through the vertex without intravenous contrast. Sagittal and coronal MPR images reconstructed from axial data set. COMPARISON:  None FINDINGS: Brain: Generalized atrophy. Normal ventricular morphology. No midline shift or mass effect. Small vessel chronic ischemic changes of deep cerebral white matter. No intracranial hemorrhage, mass lesion or evidence of acute infarction. No extra-axial fluid collections. Vascular: No hyperdense vessels. Skull: Osseous demineralization.  Skull intact. Sinuses/Orbits: Clear Other: [place] IMPRESSION: Atrophy with small vessel chronic ischemic changes of deep cerebral white matter. No acute intracranial abnormalities. Electronically Signed   By: Lavonia Dana M.D.   On: 01/27/2019 17:12    Assessment & Plan:   There are no diagnoses linked to this encounter.   No orders of the defined types were placed in this encounter.    Follow-up: No follow-ups on file.  Walker Kehr, MD

## 2019-02-25 NOTE — Assessment & Plan Note (Signed)
resolving

## 2019-02-25 NOTE — Assessment & Plan Note (Signed)
  Magnesium 400 mg a day Tylenol PM at night Epsom salt soaks

## 2019-02-25 NOTE — Assessment & Plan Note (Signed)
Better w/trekking poles

## 2019-02-25 NOTE — Patient Instructions (Signed)
For cramps:  Magnesium 400 mg a day Tylenol PM at night Epsom salt soaks

## 2019-02-25 NOTE — Assessment & Plan Note (Signed)
Hearing aids 

## 2019-05-25 ENCOUNTER — Encounter: Payer: Self-pay | Admitting: Internal Medicine

## 2019-05-25 DIAGNOSIS — H40023 Open angle with borderline findings, high risk, bilateral: Secondary | ICD-10-CM | POA: Diagnosis not present

## 2019-05-25 DIAGNOSIS — H353132 Nonexudative age-related macular degeneration, bilateral, intermediate dry stage: Secondary | ICD-10-CM | POA: Diagnosis not present

## 2019-05-25 DIAGNOSIS — H35033 Hypertensive retinopathy, bilateral: Secondary | ICD-10-CM | POA: Diagnosis not present

## 2019-05-25 DIAGNOSIS — H26493 Other secondary cataract, bilateral: Secondary | ICD-10-CM | POA: Diagnosis not present

## 2019-06-10 ENCOUNTER — Other Ambulatory Visit: Payer: Self-pay

## 2019-06-10 ENCOUNTER — Ambulatory Visit (INDEPENDENT_AMBULATORY_CARE_PROVIDER_SITE_OTHER): Payer: Medicare Other | Admitting: Internal Medicine

## 2019-06-10 ENCOUNTER — Encounter: Payer: Self-pay | Admitting: Internal Medicine

## 2019-06-10 VITALS — BP 134/64 | HR 70 | Temp 98.0°F | Ht 62.0 in | Wt 104.0 lb

## 2019-06-10 DIAGNOSIS — R5383 Other fatigue: Secondary | ICD-10-CM | POA: Diagnosis not present

## 2019-06-10 DIAGNOSIS — G4700 Insomnia, unspecified: Secondary | ICD-10-CM

## 2019-06-10 DIAGNOSIS — R269 Unspecified abnormalities of gait and mobility: Secondary | ICD-10-CM | POA: Diagnosis not present

## 2019-06-10 DIAGNOSIS — G5701 Lesion of sciatic nerve, right lower limb: Secondary | ICD-10-CM | POA: Diagnosis not present

## 2019-06-10 DIAGNOSIS — Z23 Encounter for immunization: Secondary | ICD-10-CM | POA: Diagnosis not present

## 2019-06-10 MED ORDER — DIPHENHYDRAMINE HCL 25 MG PO TABS: 12.5000 mg | ORAL_TABLET | Freq: Every evening | ORAL | 3 refills | Status: DC | PRN

## 2019-06-10 NOTE — Assessment & Plan Note (Signed)
Better w/practice

## 2019-06-10 NOTE — Assessment & Plan Note (Signed)
Cont stretching  

## 2019-06-10 NOTE — Patient Instructions (Signed)
For sleep:  Tylenol PM 1-2 tablets at night Valerian root Benadryl 12.5 mg at night

## 2019-06-10 NOTE — Addendum Note (Signed)
Addended by: Karren Cobble on: 06/10/2019 01:33 PM   Modules accepted: Orders

## 2019-06-10 NOTE — Assessment & Plan Note (Addendum)
Worse (pt gets 4 hrs of sleep every other night)  Tylenol PM 1-2 tablets at night Valerian root Benadryl 12.5 mg at night  Rx meds offered - pt declined

## 2019-06-10 NOTE — Progress Notes (Signed)
Subjective:  Patient ID: Jessica Rowland, female    DOB: 06-Jul-1932  Age: 85 y.o. MRN: [id number]  CC: No chief complaint on file.   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 aspirin EC 81 MG tablet Take 81 mg by mouth daily.    Marland Kitchen b complex vitamins tablet Take 1 tablet by mouth daily. 100 tablet 3  . Cholecalciferol (VITAMIN D3) 2000 units capsule Take 1 capsule (2,000 Units total) by mouth daily. 100 capsule 3  . Ginkgo Biloba 120 MG CAPS Take 120 mg by mouth 3 (three) times daily.    . Omega-3 Fatty Acids (FISH OIL) 1000 MG CAPS Take 1,000 mg by mouth 2 (two) times daily.    . Vinpocetine POWD 30 mg by Does not apply route 3 (three) times daily.     No facility-administered medications prior to visit.     ROS: Review of Systems  Constitutional: Positive for fatigue. Negative for activity change, appetite change, chills and unexpected weight change.  HENT: Negative for congestion, mouth sores and sinus pressure.   Eyes: Negative for visual disturbance.  Respiratory: Negative for cough and chest tightness.   Gastrointestinal: Negative for abdominal pain and nausea.  Genitourinary: Negative for difficulty urinating, frequency and vaginal pain.  Musculoskeletal: Positive for arthralgias and gait problem. Negative for back pain.  Skin: Negative for pallor and rash.  Neurological: Negative for dizziness, tremors, weakness, numbness and headaches.  Psychiatric/Behavioral: Positive for decreased concentration and sleep disturbance. Negative for confusion. The patient is not nervous/anxious.     Objective:  BP 134/64 (BP Location: Left Arm, Patient Position: Sitting, Cuff Size: Normal)   Pulse 70   Temp 98 F (36.7 C) (Oral)   Ht 5\' 2"  (1.575 m)   Wt 104 lb (47.2 kg)   SpO2 99%   BMI 19.02 kg/m   BP  Readings from Last 3 Encounters:  06/10/19 134/64  02/25/19 122/66  01/22/19 122/64    Wt Readings from Last 3 Encounters:  06/10/19 104 lb (47.2 kg)  02/25/19 102 lb (46.3 kg)  01/22/19 101 lb (45.8 kg)    Physical Exam Constitutional:      General: She is not in acute distress.    Appearance: Normal appearance. She is well-developed.  HENT:     Head: Normocephalic.     Right Ear: External ear normal.     Left Ear: External ear normal.     Nose: Nose normal.  Eyes:     General:        Right eye: No discharge.        Left eye: No discharge.     Conjunctiva/sclera: Conjunctivae normal.     Pupils: Pupils are equal, round, and reactive to light.  Neck:     Musculoskeletal: Normal range of motion and neck supple.     Thyroid: No thyromegaly.     Vascular: No JVD.     Trachea: No tracheal deviation.  Cardiovascular:     Rate and Rhythm: Normal rate and regular rhythm.     Heart sounds: Normal heart sounds.  Pulmonary:     Effort: No respiratory distress.     Breath sounds: No stridor. No wheezing.  Abdominal:     General: Bowel sounds are normal. There is no distension.     Palpations: Abdomen is  soft. There is no mass.     Tenderness: There is no abdominal tenderness. There is no guarding or rebound.  Musculoskeletal:        General: No tenderness.  Lymphadenopathy:     Cervical: No cervical adenopathy.  Skin:    Findings: No erythema or rash.  Neurological:     Mental Status: She is alert and oriented to person, place, and time.     Cranial Nerves: No cranial nerve deficit.     Motor: No abnormal muscle tone.     Coordination: Coordination normal.     Deep Tendon Reflexes: Reflexes normal.  Psychiatric:        Behavior: Behavior normal.        Thought Content: Thought content normal.        Judgment: Judgment normal.     Hips - sensitive Thin   Lab Results  Component Value Date   WBC 8.3 01/22/2019   HGB 14.6 01/22/2019   HCT 43.2 01/22/2019   PLT  275.0 01/22/2019   GLUCOSE 111 (H) 01/22/2019   ALT 16 01/22/2019   AST 14 01/22/2019   NA 133 (L) 01/22/2019   K 4.4 01/22/2019   CL 98 01/22/2019   CREATININE 0.60 01/22/2019   BUN 14 01/22/2019   CO2 26 01/22/2019   TSH 1.21 01/22/2019    Ct Head Wo Contrast  Result Date: 01/27/2019 CLINICAL DATA:  Ataxia increased since falling on 10/18/2018 and fracturing wrist, cerebellar ataxia new, question stroke, history hyperlipidemia EXAM: CT HEAD WITHOUT CONTRAST TECHNIQUE: Contiguous axial images were obtained from the base of the skull through the vertex without intravenous contrast. Sagittal and coronal MPR images reconstructed from axial data set. COMPARISON:  None FINDINGS: Brain: Generalized atrophy. Normal ventricular morphology. No midline shift or mass effect. Small vessel chronic ischemic changes of deep cerebral white matter. No intracranial hemorrhage, mass lesion or evidence of acute infarction. No extra-axial fluid collections. Vascular: No hyperdense vessels. Skull: Osseous demineralization.  Skull intact. Sinuses/Orbits: Clear Other: Nodda IMPRESSION: Atrophy with small vessel chronic ischemic changes of deep cerebral white matter. No acute intracranial abnormalities. Electronically Signed   By: Lavonia Dana M.D.   On: 01/27/2019 17:12    Assessment & Plan:   There are no diagnoses linked to this encounter.   No orders of the defined types were placed in this encounter.    Follow-up: No follow-ups on file.  Walker Kehr, MD

## 2019-06-10 NOTE — Assessment & Plan Note (Signed)
Treat insomnia 

## 2019-09-26 DIAGNOSIS — Z23 Encounter for immunization: Secondary | ICD-10-CM | POA: Diagnosis not present

## 2019-10-12 ENCOUNTER — Other Ambulatory Visit: Payer: Self-pay

## 2019-10-12 ENCOUNTER — Encounter: Payer: Self-pay | Admitting: Internal Medicine

## 2019-10-12 ENCOUNTER — Ambulatory Visit (INDEPENDENT_AMBULATORY_CARE_PROVIDER_SITE_OTHER): Payer: Medicare Other | Admitting: Internal Medicine

## 2019-10-12 DIAGNOSIS — R0789 Other chest pain: Secondary | ICD-10-CM | POA: Diagnosis not present

## 2019-10-12 DIAGNOSIS — R269 Unspecified abnormalities of gait and mobility: Secondary | ICD-10-CM

## 2019-10-12 DIAGNOSIS — G5701 Lesion of sciatic nerve, right lower limb: Secondary | ICD-10-CM

## 2019-10-12 DIAGNOSIS — R0781 Pleurodynia: Secondary | ICD-10-CM | POA: Insufficient documentation

## 2019-10-12 NOTE — Patient Instructions (Signed)
Ibuprofen 400 mg 2 times a day with food for pain if needed Heat Massage

## 2019-10-12 NOTE — Progress Notes (Signed)
Subjective:  Patient ID: Jessica Rowland, female    DOB: 1932/02/06  Age: 86 y.o. MRN: [id number]  CC: No chief complaint on file.   HPI Jessica Rowland presents for LBP x 5 days after she went shopping - pain is severe. Tylenol is helping a little... F/u gait disorder, R piriformis syndrome  Outpatient Medications Prior to Visit  Medication Sig Dispense Refill  . aspirin EC 81 MG tablet Take 81 mg by mouth daily.    Marland Kitchen b complex vitamins tablet Take 1 tablet by mouth daily. 100 tablet 3  . Cholecalciferol (VITAMIN D3) 2000 units capsule Take 1 capsule (2,000 Units total) by mouth daily. 100 capsule 3  . diphenhydrAMINE (BENADRYL ALLERGY) 25 MG tablet Take 0.5-1 tablets (12.5-25 mg total) by mouth at bedtime as needed for sleep. 60 tablet 3  . Ginkgo Biloba 120 MG CAPS Take 120 mg by mouth 3 (three) times daily.    . Omega-3 Fatty Acids (FISH OIL) 1000 MG CAPS Take 1,000 mg by mouth 2 (two) times daily.    . Vinpocetine POWD 30 mg by Does not apply route 3 (three) times daily.     No facility-administered medications prior to visit.    ROS: Review of Systems  Constitutional: Negative for activity change, appetite change, chills, fatigue and unexpected weight change.  HENT: Negative for congestion, mouth sores and sinus pressure.   Eyes: Negative for visual disturbance.  Respiratory: Negative for cough and chest tightness.   Gastrointestinal: Negative for abdominal pain and nausea.  Genitourinary: Negative for difficulty urinating, frequency and vaginal pain.  Musculoskeletal: Positive for back pain. Negative for gait problem.  Skin: Negative for pallor and rash.  Neurological: Negative for dizziness, tremors, weakness, numbness and headaches.  Psychiatric/Behavioral: Negative for confusion and sleep disturbance.    Objective:  BP 140/72 (BP Location: Left Arm, Patient Position: Sitting, Cuff Size: Normal)   Pulse 99   Temp 98 F (36.7 C) (Oral)   Ht 5\' 2"  (1.575 m)   Wt  100 lb (45.4 kg)   SpO2 98%   BMI 18.29 kg/m   BP Readings from Last 3 Encounters:  10/12/19 140/72  06/10/19 134/64  02/25/19 122/66    Wt Readings from Last 3 Encounters:  10/12/19 100 lb (45.4 kg)  06/10/19 104 lb (47.2 kg)  02/25/19 102 lb (46.3 kg)    Physical Exam Constitutional:      General: She is not in acute distress.    Appearance: She is well-developed.  HENT:     Head: Normocephalic.     Right Ear: External ear normal.     Left Ear: External ear normal.     Nose: Nose normal.  Eyes:     General:        Right eye: No discharge.        Left eye: No discharge.     Conjunctiva/sclera: Conjunctivae normal.     Pupils: Pupils are equal, round, and reactive to light.  Neck:     Thyroid: No thyromegaly.     Vascular: No JVD.     Trachea: No tracheal deviation.  Cardiovascular:     Rate and Rhythm: Normal rate and regular rhythm.     Heart sounds: Normal heart sounds.  Pulmonary:     Effort: No respiratory distress.     Breath sounds: No stridor. No wheezing.  Abdominal:     General: Bowel sounds are normal. There is no distension.     Palpations: Abdomen is soft.  There is no mass.     Tenderness: There is no abdominal tenderness. There is no guarding or rebound.  Musculoskeletal:        General: Tenderness present.     Cervical back: Normal range of motion and neck supple.  Lymphadenopathy:     Cervical: No cervical adenopathy.  Skin:    Findings: No erythema or rash.  Neurological:     Cranial Nerves: No cranial nerve deficit.     Motor: No abnormal muscle tone.     Coordination: Coordination normal.     Deep Tendon Reflexes: Reflexes normal.  Psychiatric:        Behavior: Behavior normal.        Thought Content: Thought content normal.        Judgment: Judgment normal.    R paraspinal muscle tender in one spot   Lab Results  Component Value Date   WBC 8.3 01/22/2019   HGB 14.6 01/22/2019   HCT 43.2 01/22/2019   PLT 275.0 01/22/2019    GLUCOSE 111 (H) 01/22/2019   ALT 16 01/22/2019   AST 14 01/22/2019   NA 133 (L) 01/22/2019   K 4.4 01/22/2019   CL 98 01/22/2019   CREATININE 0.60 01/22/2019   BUN 14 01/22/2019   CO2 26 01/22/2019   TSH 1.21 01/22/2019    CT Head Wo Contrast  Result Date: 01/27/2019 CLINICAL DATA:  Ataxia increased since falling on 10/18/2018 and fracturing wrist, cerebellar ataxia new, question stroke, history hyperlipidemia EXAM: CT HEAD WITHOUT CONTRAST TECHNIQUE: Contiguous axial images were obtained from the base of the skull through the vertex without intravenous contrast. Sagittal and coronal MPR images reconstructed from axial data set. COMPARISON:  None FINDINGS: Brain: Generalized atrophy. Normal ventricular morphology. No midline shift or mass effect. Small vessel chronic ischemic changes of deep cerebral white matter. No intracranial hemorrhage, mass lesion or evidence of acute infarction. No extra-axial fluid collections. Vascular: No hyperdense vessels. Skull: Osseous demineralization.  Skull intact. Sinuses/Orbits: Clear Other: Nodda IMPRESSION: Atrophy with small vessel chronic ischemic changes of deep cerebral white matter. No acute intracranial abnormalities. Electronically Signed   By: Lavonia Dana M.D.   On: 01/27/2019 17:12    Assessment & Plan:   There are no diagnoses linked to this encounter.   No orders of the defined types were placed in this encounter.    Follow-up: No follow-ups on file.  Walker Kehr, MD

## 2019-10-12 NOTE — Assessment & Plan Note (Signed)
Cont w/stretching

## 2019-10-12 NOTE — Assessment & Plan Note (Signed)
Balance is better

## 2019-10-12 NOTE — Assessment & Plan Note (Addendum)
R paraspinal, rhomboid strain Ibuprofen 400 mg 2 times a day with food for pain if needed Heat Massage Refused X ray Toradol 30 mg IM RTC 2 wks

## 2019-10-13 DIAGNOSIS — G5701 Lesion of sciatic nerve, right lower limb: Secondary | ICD-10-CM | POA: Diagnosis not present

## 2019-10-13 MED ORDER — KETOROLAC TROMETHAMINE 30 MG/ML IJ SOLN
30.0000 mg | Freq: Once | INTRAMUSCULAR | Status: AC
  Administered 2019-10-13: 30 mg via INTRAMUSCULAR

## 2019-10-13 NOTE — Addendum Note (Signed)
Addended by: Karren Cobble on: 10/13/2019 08:44 AM   Modules accepted: Orders

## 2019-10-25 DIAGNOSIS — Z23 Encounter for immunization: Secondary | ICD-10-CM | POA: Diagnosis not present

## 2019-11-24 DIAGNOSIS — H353132 Nonexudative age-related macular degeneration, bilateral, intermediate dry stage: Secondary | ICD-10-CM | POA: Diagnosis not present

## 2019-11-24 DIAGNOSIS — H26493 Other secondary cataract, bilateral: Secondary | ICD-10-CM | POA: Diagnosis not present

## 2019-11-24 DIAGNOSIS — H04123 Dry eye syndrome of bilateral lacrimal glands: Secondary | ICD-10-CM | POA: Diagnosis not present

## 2019-12-16 DIAGNOSIS — H40023 Open angle with borderline findings, high risk, bilateral: Secondary | ICD-10-CM | POA: Diagnosis not present

## 2019-12-16 DIAGNOSIS — H353132 Nonexudative age-related macular degeneration, bilateral, intermediate dry stage: Secondary | ICD-10-CM | POA: Diagnosis not present

## 2019-12-16 DIAGNOSIS — H2589 Other age-related cataract: Secondary | ICD-10-CM | POA: Diagnosis not present

## 2019-12-16 DIAGNOSIS — H26493 Other secondary cataract, bilateral: Secondary | ICD-10-CM | POA: Diagnosis not present

## 2019-12-16 DIAGNOSIS — H26492 Other secondary cataract, left eye: Secondary | ICD-10-CM | POA: Diagnosis not present

## 2020-01-14 ENCOUNTER — Other Ambulatory Visit: Payer: Self-pay

## 2020-01-14 ENCOUNTER — Encounter: Payer: Self-pay | Admitting: Internal Medicine

## 2020-01-14 ENCOUNTER — Ambulatory Visit (INDEPENDENT_AMBULATORY_CARE_PROVIDER_SITE_OTHER): Payer: Medicare Other | Admitting: Internal Medicine

## 2020-01-14 VITALS — BP 132/70 | HR 76 | Temp 97.9°F | Ht 62.0 in | Wt 101.6 lb

## 2020-01-14 DIAGNOSIS — I8311 Varicose veins of right lower extremity with inflammation: Secondary | ICD-10-CM | POA: Insufficient documentation

## 2020-01-14 NOTE — Patient Instructions (Signed)
We have given you the compression stocking prescription and will get you in with the vein doctor.

## 2020-01-14 NOTE — Assessment & Plan Note (Signed)
Given rx for compression stockings and referral to vascular for intervention.

## 2020-01-14 NOTE — Progress Notes (Signed)
   Subjective:   Patient ID: Jessica Rowland, female    DOB: 04-25-1932, 86 y.o.   MRN: [id number]  HPI The patient is an 86 YO female coming in for concerns about right leg. She feels she is having problems with the veins. Started long time ago. She is having some leaking and pain from the vein in the last day or so. Was concerned about them bleeding or having a problem. Have not tried anything for this. Interpretor present for interview.    [phone] Marland Kitchen Raynelle Chary to schedule  Review of Systems  Constitutional: Negative.   HENT: Negative.   Eyes: Negative.   Respiratory: Negative for cough, chest tightness and shortness of breath.   Cardiovascular: Negative for chest pain, palpitations and leg swelling.  Gastrointestinal: Negative for abdominal distention, abdominal pain, constipation, diarrhea, nausea and vomiting.  Musculoskeletal: Negative.   Skin: Positive for color change.  Neurological: Negative.   Psychiatric/Behavioral: Negative.     Objective:  Physical Exam Constitutional:      Appearance: She is well-developed.  HENT:     Head: Normocephalic and atraumatic.  Cardiovascular:     Rate and Rhythm: Normal rate and regular rhythm.  Pulmonary:     Effort: Pulmonary effort is normal. No respiratory distress.     Breath sounds: Normal breath sounds. No wheezing or rales.  Abdominal:     General: Bowel sounds are normal. There is no distension.     Palpations: Abdomen is soft.     Tenderness: There is no abdominal tenderness. There is no rebound.  Musculoskeletal:     Cervical back: Normal range of motion.  Skin:    General: Skin is warm and dry.     Comments: Large varicose vein right shin with tenderness to palpation  Neurological:     Mental Status: She is alert and oriented to person, place, and time.     Coordination: Coordination normal.     Vitals:   01/14/20 0852  BP: 132/70  Pulse: 76  Temp: 97.9 F (36.6 C)  SpO2: 99%  Weight: 101 lb  9.6 oz (46.1 kg)  Height: 5\' 2"  (1.575 m)    This visit occurred during the SARS-CoV-2 public health emergency.  Safety protocols were in place, including screening questions prior to the visit, additional usage of staff PPE, and extensive cleaning of exam room while observing appropriate contact time as indicated for disinfecting solutions.   Assessment & Plan:

## 2020-02-22 ENCOUNTER — Other Ambulatory Visit: Payer: Self-pay

## 2020-02-22 DIAGNOSIS — I8311 Varicose veins of right lower extremity with inflammation: Secondary | ICD-10-CM

## 2020-02-28 ENCOUNTER — Ambulatory Visit (HOSPITAL_COMMUNITY)
Admission: RE | Admit: 2020-02-28 | Discharge: 2020-02-28 | Disposition: A | Discharge status: Home / Self Care | Payer: Medicare Other | Source: Ambulatory Visit | Attending: Surgery | Admitting: Surgery

## 2020-02-28 ENCOUNTER — Other Ambulatory Visit: Payer: Self-pay

## 2020-02-28 DIAGNOSIS — I8311 Varicose veins of right lower extremity with inflammation: Secondary | ICD-10-CM | POA: Insufficient documentation

## 2020-03-01 ENCOUNTER — Encounter: Payer: Self-pay | Admitting: Vascular Surgery

## 2020-03-01 ENCOUNTER — Ambulatory Visit (INDEPENDENT_AMBULATORY_CARE_PROVIDER_SITE_OTHER): Payer: Medicare Other | Admitting: Vascular Surgery

## 2020-03-01 ENCOUNTER — Other Ambulatory Visit: Payer: Self-pay

## 2020-03-01 VITALS — BP 150/60 | HR 69 | Temp 97.3°F | Resp 14 | Ht 61.0 in | Wt 98.0 lb

## 2020-03-01 DIAGNOSIS — I83813 Varicose veins of bilateral lower extremities with pain: Secondary | ICD-10-CM

## 2020-03-01 DIAGNOSIS — I8393 Asymptomatic varicose veins of bilateral lower extremities: Secondary | ICD-10-CM

## 2020-03-01 NOTE — Progress Notes (Signed)
Referring Physician: Dr Sharlet Salina  Patient name: Jessica Rowland MRN: [id number] DOB: [birth year] Sex: female  REASON FOR CONSULT: Symptomatic varicose veins with pain right leg   Interview was conducted through an in person [facility] interpreter HPI: Jessica Rowland is a 86 y.o. female, with a longstanding history of slowly progressive worsening of varicose veins in her right leg.  She has now developed one area of her right leg that is almost blisterlike that she is worried that may bleed.  She has not really worn compression stockings in the past.  She does not really complain of swelling.  She states that when she walks sometimes it helps her pain better.  She also gets relief of her symptoms by elevating her legs and her legs are fairly fresh in the morning after a night of sleep.  She does have varicose veins in the left leg as well but these are not as troublesome to her.    Past Medical History:  Diagnosis Date  . Hyperlipidemia   . Nodular basal cell carcinoma (BCC) 10/20/2017   Left Side Face (Cx3,5FU)  . Varicose veins of bilateral lower extremities with pain    Past Surgical History:  Procedure Laterality Date  . ABDOMINAL HYSTERECTOMY      Family History  Problem Relation Age of Onset  . Arthritis Mother     SOCIAL HISTORY: Social History   Socioeconomic History  . Marital status: Widowed    Spouse name: Not on file  . Number of children: Not on file  . Years of education: Not on file  . Highest education level: Not on file  Occupational History  . Not on file  Tobacco Use  . Smoking status: Never Smoker  . Smokeless tobacco: Never Used  Substance and Sexual Activity  . Alcohol use: No  . Drug use: No  . Sexual activity: Never  Other Topics Concern  . Not on file  Social History Narrative  . Not on file   Social Determinants of Health   Financial Resource Strain:   . Difficulty of Paying Living Expenses:   Food Insecurity:   . Worried About  Charity fundraiser in the Last Year:   . Arboriculturist in the Last Year:   Transportation Needs:   . Film/video editor (Medical):   Marland Kitchen Lack of Transportation (Non-Medical):   Physical Activity:   . Days of Exercise per Week:   . Minutes of Exercise per Session:   Stress:   . Feeling of Stress :   Social Connections:   . Frequency of Communication with Friends and Family:   . Frequency of Social Gatherings with Friends and Family:   . Attends Religious Services:   . Active Member of Clubs or Organizations:   . Attends Archivist Meetings:   Marland Kitchen Marital Status:   Intimate Partner Violence:   . Fear of Current or Ex-Partner:   . Emotionally Abused:   Marland Kitchen Physically Abused:   . Sexually Abused:     No Known Allergies  Current Outpatient Medications  Medication Sig Dispense Refill  . b complex vitamins tablet Take 1 tablet by mouth daily. 100 tablet 3  . Cholecalciferol (VITAMIN D3) 2000 units capsule Take 1 capsule (2,000 Units total) by mouth daily. 100 capsule 3  . diphenhydrAMINE (BENADRYL ALLERGY) 25 MG tablet Take 0.5-1 tablets (12.5-25 mg total) by mouth at bedtime as needed for sleep. 60 tablet 3  . Ginkgo Biloba 120 MG  CAPS Take 120 mg by mouth 3 (three) times daily.    . Omega-3 Fatty Acids (FISH OIL) 1000 MG CAPS Take 1,000 mg by mouth 2 (two) times daily.    Marland Kitchen aspirin EC 81 MG tablet Take 81 mg by mouth daily. (Patient not taking: Reported on 03/01/2020)     No current facility-administered medications for this visit.    ROS:   General:  No weight loss, Fever, chills  HEENT: No recent headaches, no nasal bleeding, no visual changes, no sore throat  Neurologic: No dizziness, blackouts, seizures. No recent symptoms of stroke or mini- stroke. No recent episodes of slurred speech, or temporary blindness.  Cardiac: No recent episodes of chest pain/pressure, no shortness of breath at rest.  No shortness of breath with exertion.  Denies history of atrial  fibrillation or irregular heartbeat  Vascular: No history of rest pain in feet.  No history of claudication.  No history of non-healing ulcer, No history of DVT   Pulmonary: No home oxygen, no productive cough, no hemoptysis,  No asthma or wheezing  Musculoskeletal:  [ ]  Arthritis, [ ]  Low back pain,  [ ]  Joint pain  Hematologic:No history of hypercoagulable state.  No history of easy bleeding.  No history of anemia  Gastrointestinal: No hematochezia or melena,  No gastroesophageal reflux, no trouble swallowing  Urinary: [ ]  chronic Kidney disease, [ ]  on HD - [ ]  MWF or [ ]  TTHS, [ ]  Burning with urination, [ ]  Frequent urination, [ ]  Difficulty urinating;   Skin: No rashes  Psychological: No history of anxiety,  No history of depression   Physical Examination  Vitals:   03/01/20 1013  BP: (!) 150/60  Pulse: 69  Resp: 14  Temp: (!) 97.3 F (36.3 C)  TempSrc: Temporal  SpO2: 99%  Weight: 98 lb (44.5 kg)  Height: 5\' 1"  (1.549 m)    Body mass index is 18.52 kg/m.  General:  Alert and oriented, no acute distress HEENT: Normal Neck: No JVD Cardiac: Regular Rate and Rhythm Skin: No rash, diffuse bilateral lower extremity varicose veins especially one area on the right pretibial region which is very superficial and at risk for bleeding       Extremity Pulses:  2+ radial, brachial, femoral, dorsalis pedis, posterior tibial pulses bilaterally Musculoskeletal: No deformity or edema  Neurologic: Upper and lower extremity motor 5/5 and symmetric  DATA:  Patient had a venous reflux exam yesterday which I reviewed and interpreted.  She had evidence of deep vein reflux on the right with a dilated right greater saphenous vein was about 5 mm in diameter throughout its course although it is narrowed in its mid segment to about 1 mm and branching in this area.  Left leg also had common femoral vein and superficial femoral vein popliteal reflux.  Greater saphenous vein also had  reflux but vein diameter was only about 3 mm on the left side.  ASSESSMENT: Symptomatic varicose veins right worse than the left with a varicosity on the surface of the right leg at risk for bleeding.  Patient has dilation and reflux diffusely throughout the right greater saphenous vein.  She does have 1 narrowed area in the middle of the vein so most likely we would have to either do skip laser ablation or ablate from the mid thigh to the saphenofemoral junction which would be my preference.  We would then proceed to do greater than 20 stab avulsions.  Venous pathophysiology was discussed with the  patient today as well as procedure risk benefits and possible complications.   PLAN: Patient will do a trial of 3 months of compression therapy to see if she gets symptomatic relief.  If no relief or she has any bleeding episodes we would consider laser ablation of the right greater saphenous vein greater than 20 stab avulsions.   Ruta Hinds, MD [facility] Office: [phone] Pager: [phone]

## 2020-03-02 ENCOUNTER — Encounter: Payer: Medicare Other | Admitting: Vascular Surgery

## 2020-03-02 ENCOUNTER — Encounter (HOSPITAL_COMMUNITY): Payer: Medicaid Other

## 2020-03-06 ENCOUNTER — Other Ambulatory Visit: Payer: Self-pay | Admitting: Internal Medicine

## 2020-03-09 ENCOUNTER — Encounter: Payer: Medicare Other | Admitting: Vascular Surgery

## 2020-03-09 ENCOUNTER — Encounter (HOSPITAL_COMMUNITY): Payer: Medicaid Other

## 2020-03-16 DIAGNOSIS — H9201 Otalgia, right ear: Secondary | ICD-10-CM | POA: Insufficient documentation

## 2020-03-16 DIAGNOSIS — H903 Sensorineural hearing loss, bilateral: Secondary | ICD-10-CM | POA: Diagnosis not present

## 2020-03-16 DIAGNOSIS — H9113 Presbycusis, bilateral: Secondary | ICD-10-CM | POA: Diagnosis not present

## 2020-03-23 ENCOUNTER — Ambulatory Visit (INDEPENDENT_AMBULATORY_CARE_PROVIDER_SITE_OTHER): Payer: Medicare Other

## 2020-03-23 ENCOUNTER — Ambulatory Visit (INDEPENDENT_AMBULATORY_CARE_PROVIDER_SITE_OTHER): Payer: Medicare Other | Admitting: Internal Medicine

## 2020-03-23 ENCOUNTER — Encounter: Payer: Self-pay | Admitting: Internal Medicine

## 2020-03-23 ENCOUNTER — Other Ambulatory Visit: Payer: Self-pay

## 2020-03-23 VITALS — BP 140/70 | HR 84 | Temp 98.1°F | Ht 61.0 in | Wt 97.0 lb

## 2020-03-23 DIAGNOSIS — R0789 Other chest pain: Secondary | ICD-10-CM | POA: Diagnosis not present

## 2020-03-23 DIAGNOSIS — B001 Herpesviral vesicular dermatitis: Secondary | ICD-10-CM | POA: Diagnosis not present

## 2020-03-23 DIAGNOSIS — B029 Zoster without complications: Secondary | ICD-10-CM | POA: Insufficient documentation

## 2020-03-23 DIAGNOSIS — M546 Pain in thoracic spine: Secondary | ICD-10-CM | POA: Diagnosis not present

## 2020-03-23 MED ORDER — IBUPROFEN 400 MG PO TABS: 400.0000 mg | ORAL_TABLET | Freq: Three times a day (TID) | ORAL | 1 refills | Status: DC | PRN

## 2020-03-23 MED ORDER — TRIAMCINOLONE ACETONIDE 0.5 % EX CREA: 1.0000 "application " | TOPICAL_CREAM | Freq: Three times a day (TID) | CUTANEOUS | 1 refills | Status: AC

## 2020-03-23 MED ORDER — ACYCLOVIR 400 MG PO TABS: 800.0000 mg | ORAL_TABLET | Freq: Four times a day (QID) | ORAL | 1 refills | Status: DC

## 2020-03-23 MED ORDER — ACYCLOVIR 400 MG PO TABS: 400.0000 mg | ORAL_TABLET | Freq: Three times a day (TID) | ORAL | 3 refills | Status: DC

## 2020-03-23 NOTE — Assessment & Plan Note (Signed)
Recurrent Acyclovir low dose prn

## 2020-03-23 NOTE — Patient Instructions (Signed)
?????? Cold Sore  ??????, ??????? ????? ???????? ????????????? ??????????, ??????????????? ???????? ?????????, ??????????? ????????? ????, ??????? ?????????? ? ??????? ??? ??? ?? ?????, ??????, ????, ?????????? ??? ?????. ????????????? ????????? ????? ???????????????? ?? ?????? ????? ????, ????????, ?? ????? ??? ??????. ? ????????? ????? ? ??????? ????????????? ????????????? ????????? ????? ???????????????? ?? ?????? ????????? ??????? ????, ??????? ??????? ??????. ????????? ??? ??????? ????? ???????????? ?? ?????? ???????? ??????? (???????? ?????????????) ?? ??????? ???????? ????????? ??????. ? ??????????? ??????? ??????? ???????? ? ??????? 2 ??????. ?????? ???????? ?????? ???????? ?????????, ????????? ???????????????? ????? ?????? ???????? ??????? (???-1). ???-1 ????? ?????? ? ??????? ???-2, ??????? ???????? ??????????? ??????, ?? ??? ?????? ??????. ????? ????????????? ???-1 ? ???????? ???????? ????? ???????? ? ??? ????????. ???-1 ?????????? ?? ?????? ???????? ??????? ??? ???????????????? ????????, ????????, ?? ????? ????????, ????????????? ? ??????????? ??????? ???? ??? ??? ??????????? ?????????? ?????? ??????? ???????? ????????, ????????, ???????, ???????, ???????? ??? ????????? ?????????. ??? ???????? ????? ??????????? ????????? ????? ????????? ????, ???? ??:  ??????, ??????????? ?????? ??? ???????.  ? ??? ???????????.  ?????????.  ?????????? ???????????? ?????????.  ?????????? ??? ???????????? ???????? ?????? ??? ????? ??????? ?? ??????. ?????? ???????? ??? ????????? ???????? ??????? ??????????? ? ????????? ?????? ??????. ?????? ???????:  ???????????, ??? ??? ?????? ????????? ?? 1-2 ??? ?? ?????????.  ?? ?????, ? ??????? ???, ?? ???? ??? ?? ????? ?????????? ????????, ??????????? ?????????.  ?? ????????? ???????? ?????????? ?????????? ????????.  ?????????????? ??????? ?????????, ?? ??? ?????????? ?????? ?????.  ????? ????????????. ? ????????? ??????? ?? ????? ???????? ???????  ????? ??????????? ?????? ????????. ??? ????? ????:  ?????????.  ???? ? ?????.  ???????? ????.  ???????? ????.  ???? ????????????? ????? ?? ???. ??? ?????? ???????? ??? ????????? ????? ???? ??????????????? ?? ?????? ???????? ? ???????????? ???????. ??? ??????? ???? ????? ??????? ?????? ????? ??? ????? ????? ???????? ?? ????? ????????? ? ???????? ??? ? ??????????? ??? ????????. ??? ??? ?????? ??????, ??? ????????, ????????? ???-1, ?????????. ??????? ??? ???????????? ???-1 ????? ???????????. ? ??????????? ??????? ??????? ???????? ??? ??????? ? ??????? 2 ??????. ???????? ?? ???????? ? ??????? ????????????? ?????????? ??????????, ?? ??? ??????? ???? ????? ????????? ??? ????????????? ????????? ???:  ?????????? ????, ????????? ? ????????.  ?????????????? ??????????? ??????.  ?????????? ??????? ??????????. ????????? ????? ???? ? ???? ????, ?????, ???????? ??? ?????. ? ???????? ???????? ???????? ????? ????????????: ????????????? ?????????  ?????????? ??? ?????????? ????????????? ????????, ??????????? ??? ???????, ? ??????????? ????????? ?????? ??? ??????? ????? ??????? ??????.  ??? ????????? ???? ??? ???? ??????????? ?????? ???????.  ???????? ? ?????? ???????? ?????, ????? ?? ??? ????????? ??????? ? ???????. ???????????? ????????, ??? ????? ???????????? ????? ???????? ?????????? ?????? ????????? ??? ??????? ? ?????????????? ?????????. ???? ?? ??????? ?????????   ?????? ??????????? ? ???????? ??? ??????? ???????.  ????? ????? ????. ?? ???????????? ? ??????, ?????????????? ?? ????? ????????? ????.  ???????, ????? ??????? ???? ?????? ? ???????.  ????????????? ??? ? ?????? ?????????, ???? ??? ?????????: ? ???????? ??? ? ??????????? ?????. ? ?????????? ????????? ????? ????? ????? ? ???????. ? ????????????? ????? ?? ????? ?? 20????? 2-3???? ? ????. ??? ? ?????  ????? ??????, ???????????? ????. ????????? ???????????? ???????, ???????? ??? ??????? ????.  ???? ?? ??????????? ???? ???  ????? ?? ???????, ??????????? ??????????.  ???????????? ????????, ??????? ???????, ????? ??? ????, ???? ? ???????? ????????.  ????????? ???????????? ?????????, ????????, ?????? ? ????????. ??? ???????? ?????? ??????????? ????????? ??? ????????? ???????, ??? ????? ???????? ? ??????????? ??????. ????? ?????  ??????????? ???????, ???????? ???? ??? ?????????? ??????????? ??????? ?????? ?? ?????????? ???????.  ??????, ?????? ??? ? ?????????? ?? ?????? ????? ???????? ? ?????????. ???????????: ? ??????? ??????????, ??????? ???????? ??? ????????????, ????? ??? ???????? ??????????? ?????????? ??? ?????????. ? ?????????? ??????????. ? ???????? ?????????????? ???? ?? ???? ????? ???, ??? ????? ?? ??????. ?????????? ? ???????? ?????, ????:  ???????? ? ??? ??????????? ??????  2??????.  ?? ??????? ?????????? ????.  ? ??? ????????? ?????????????????? ???????????.  ????????? ???? ??? ??????????? ? ?????.  ????????? ????????? ?? ??????????.  ??????? ?? ???????? ?? 2 ??????.  ? ??? ????? ?????? ???????? ???????. ?????????? ?????????? ?? ???????, ???? ? ???:  ???????? ????????? ? ???????? ???????? ??????????.  ???????? ???? ? ??????????? ????????.  ?????????? ???????????? ??? ?????? ????????.  ???????? ??? ??? ???????????????? ? ?????. ??????  ??????, ??????? ????? ???????? ????????????? ??????????, ??????????????? ???????? ?????????, ??????????? ????????? ????, ??????? ?????????? ? ??????? ??? ??? ?? ?????, ??????, ????, ?????????? ??? ?????.  ? ??????????? ??????? ??????? ???????? ??? ??????? ? ??????? 2 ??????. ??? ??????? ???? ????? ????????? ?????????, ??????? ??????? ????? ????, ?????????? ??????????? ?????? ? ????????? ????? ??????????.  ????? ????? ????. ?? ???????????? ? ??????, ?????????????? ?? ????? ????????? ????.  ??????????? ???????, ???????? ???? ??? ?????????? ??????????? ??????? ?????? ?? ?????????? ???????.  ???? ??????? ?? ??????? ? ??????? 2 ??????, ?????????? ?  ?????. ??? ?????????? ?? ????? ???????? ??????, ??????????????? ????? ??????. ??????????? ???????? ????? ???????????? ??? ??????? ? ????? ??????? ??????. Document Revised: 01/19/2018 Document Reviewed: 01/19/2018 Elsevier Patient Education  2020 [employer]. ????????? ??????????? ????? Trigeminal Neuralgia  ????????? ??????????? ????? -- ??? ??????????????? ????????????, ???????????????? ??????? ????? ? ????? ??????? ????. ???? ????? ??????? ?? ?????????? ?????? ?? ?????????? ?????. ???? ????????????, ??? ???????, ? ????? ??????? ????. ???????? ????? ?????????? ?? ?????????? ????, ?????? ??? ???????, ? ????? ???????? ?? ?????? ??? ????. ???? ????? ????????? ? ???? ???????, ??? ??????. ?????? ???????? ???????? ????? ????????? ???????? ??????????? ??? ??????????? ?????, ?????????????? ? ?????? ? ??????????? ?????????? ??????. ????????? ???????? ????? ???? ???????:  ??????????.  ????????.  ?????????? ???????.  ????????? ????.  ??????? ????.  ??????? ?????.  ?????????????? ? ????. ??? ???????? ????? ??????????? ????????? ????? ????????? ? ??? ??????????, ???? ??:  ? ???????? 50 ??? ??? ??????.  ???????. ?????? ???????? ??? ????????? ???????? ????????? ????? ??????????? ???????? ??????? ???? ?:  ???????.  ?????.  ??????.  ????.  ???? ??????.  ???.  ????. ??? ???? ????? ????:  ???????.  ???????.  ?????????? ???? ?????.  ?????????? ???? ??? ?????. ??? ??? ?????????????? ??? ????????? ????????????? ? ?????? ???????????? ????????????. ????? ??????????? ?? (???????????? ??????????) ??? ???, ????? ????????? ?????? ?????????, ??????? ????? ??????? ???? ? ??????? ????. ??? ??? ?????? ??? ????????? ????? ?????? ? ???????:  ????????? ????????, ??????? ???????? ???? ????????.  ?????? ??????????? ?????????? (????????????????? ?????????).  ?????????????? ?????????????. ??? ????? ????????? ? ??????? ???????, ???? ?????? ??????? ?? ????????? ?????????.  ???????????  ????? ????????, ??? ???????, ??????????? ??????? ??? ??????? ???????. ??? ?????????? ???? ????? ????????????? ?? ?????? ?????? ???????. ? ???????? ???????? ???????? ????? ????????????: ??????????? ????  ?????????? ???????? ?????? ? ???, ??? ?????????? ???? ????. ???????? ? ?????? ???????? ?????, ??????? ?? ?????????? ? ??????????? ?? ?????????????.  ?????????? ?????????? ? ??????????????? (??????-??????????) ? ???, ??? ????????? ? ?????.  ????????? ? ????????????? ? ?????? ????????? ????? ? ?????. ????? ????????  ?????????? ?????????????? ? ??????????? ????????????? ????????? ?????? ??? ??????? ????? ??????? ??????.  ????????? ????????, ??????? ???????? ? ??? ????????. ????? ???? ????????: ? ?????? ?? ???????? ??????? ???. ? ???????? ????????????? ? ????. ? ???????? ??????? ???????? ??? ????????? ???????.  ??????????????? ?????? ????? ???????, ??? ??????? ????? ??????? ??????. ??? ????? ????????: ? ??????????? ??? ????????????? ???????. ? ??????, ?????????? ?????????? ??????????. ? ????????? ??? ????. ? ????????????.  ????????? ?? ??? ?????? ???????????? ?????????? ? ???????????? ? ?????????? ?????? ???????? ?????. ????? ?????????? ????????????? ? ?????????? ?????????? ?? ????? ??????????, ????? ????????? ? ????????????? ??????????? ???????. ??? ????? ????? ?????????????? ??????????  [employer] (?????????? ?? ???????? ???? ? ??????? ????): fpa-support.org ?????????? ? ???????? ?????, ????:  ???? ????????? ?? ???????? ????? ????????.  ? ??? ????????? ???????? ??????? ?? ?????????????? ????????, ???????????? ??? ???????.  ? ??? ????????? ????? ???????????? ????????, ????????: ? ??????? ? ??????. ? ???????? ???? ????. ? ???????? ????. ? ????????? ????? ??? ??????????.  ?? ??????????? ?????????. ?????????? ?????????? ?? ???????, ????:  ?? ??????????? ??????? ????, ??????? ?? ???????.  ? ??? ????????? ????? ? ????????????. ???? ?? ????????????, ??? ??????  ????????? ???? ???? ??? ?????? ?????, ??? ? ??? ????????? ????? ? ???, ????? ????????? ? ?????, ????? ?? ?????????? ?? ???????. ?? ?????? ?????? ? ???? ????????? ????????? ?????????? ?????? ??? ?????????:  ? ??????? ?????? ?????????? ?????? (? ??? ?? ?????? 911).  ?? ??????? ????? ?????????? ??????????????? ??????, ????????, ? National Suicide Prevention Lifeline (???????????? ?????? ?? ?????????????? ???????????) ?? ?????? [phone]. ??? ???????? 24 ???? ? ?????. ??????  ????????? ??????????? ????? -- ??? ??????????????? ????????????, ???????????????? ??????? ????? ? ????? ??????? ????. ???? ????? ??????? ?? ?????????? ?????? ?? ?????????? ?????.  ???????? ????? ????????? ???????? ??????????? ??? ??????????? ?????, ?????????????? ? ?????? ? ??????????? ?????????? ??????.  ??????? ????? ???????? ? ???? ????????? ????????, ??????? ???????? ? ??? ????????, ????? ????????????? ?????????? ??? ????????????? ????????????? ??? ?????????. ??? ?????????? ???? ????? ????????????? ?? ?????? ?????? ???????.  ????????? ????????, ??????? ???????? ? ??? ????????.  ????????? ?? ??? ?????? ???????????? ?????????? ? ???????????? ? ?????????? ?????? ???????? ?????. ????? ?????????? ????????????? ? ?????????? ?????????? ?? ????? ??????????, ????? ????????? ? ????????????? ??????????? ???????. ??? ?????????? ?? ????? ???????? ??????, ??????????????? ????? ??????. ??????????? ???????? ????? ???????????? ??? ??????? ? ????? ??????? ??????. Document Revised: 07/07/2018 Document Reviewed: 07/07/2018 Elsevier Patient Education  2020 [employer]. ???????????? ?????? Shingles  ???????????? ??????, ????????? ????? ??? ????????? ???????????? ?????, - ??? ????????, ??????? ?????????????? ?????????? ?? ???? ??????????? ???? ? ??????????, ???????????? ?????????. ?? ???????? ?????. ???????????? ?????? ??????????? ?????? ? ?????, ???????:  ?????? ?????????.  ???????? ????????? ??? ?????? ?? ???????? ???? (????  ?????????????). ???????????? ?????? ? ????? ???? ?????? ??????????? ?????. ?????? ???????? ???????????? ?????? ???????? ????? ???????? ???? (varicella-zoster virus, VZV). ??? ??? ?? ????? ?????, ??????? ???????? ????????. ????? ??????????? VZV ???? ????? ???????? ? ????????? ? ?????????? (?????????) ?????????. ???????????? ?????? ???????????, ???? ???? ????? ??????????????. ??? ????? ????????? ????? ????????? ??? ????? ??????? (????????????) ???????? ? VZV. ??????? ??????????? ??????? ?????? ??????????. ??? ???????? ???? ???????? ????? ???????????? ???? ???????? ????????????? ??????? ??????? ? ?????, ??????? ?????? ????????? ??? ??????????? ???????? ?????? ???????? ????. ??? ???????? ???? ??????????? ? ?????, ???????:  ? ???????? ?????? 60 ???.  ????? ??????????? ??????? ???????????????? ?????? (???????????????). ? ???????????? ???. ? ???????? ??????. ? ????? ??????? ???????.  ????????? ?????????, ??????????? ???????? ???????, ????????, ? ???, ???? ???? ??????? ????????? ???????.  ?????????? ??????? ?????????? ????????. ?????? ???????? ??? ????????? ?????? ???????? ????? ??????????? ???????? ???, ??????????? ? ???? ?? ??????? ????. ??????????? ???????? ????? ??????????? ??? ??????, ??????????? ??? ???????????? ????. ????? ????????? ???? ??? ?????? ????? ????????????? ????????? ?????????? ??????????? ??????? ????. ???? ?????? ???????????? ?? ????? ???????? ???? ? ???? ????? ??? ??????. ??? ???? ? ???????? ????? ???????????? ? ??????????? ????????? ????????, ??????? ???????????, ?????????, ????????? ??????? ? ????????? ? ??????? 2-3 ??????. ? ????? ????? ? ???? ?????? ???????? ????? ??????????:  ?????????.  ?????.  ???????? ????.  ???????????? ???????. ??? ?????? ??????? ????? ???????????? ??? ??????????? ????????????? ??? ???????????? ????. ??? ????????????? ???????? ????? ????? ??????? ???? ??? ???????? ?? ?????????. ??????? ????????? ??? ??????????? ??? ?????????? ?? ??????????????  ????????????. ??? ????? ??? ???????????? ????????? ?? ???? ????? ?????????? ? ??????? ?????????? ??????. ???????????? ??????? ????? ????????? ?? ??????????. ??? ??????? ????, ????????, ???????? ??? ????? ??????????, ??????? ????????? ????, ???????????? ??????????? ?????????????? ? ??????? ???????? ???????? ???????????? ??????????. ????? ??????????? ???????? ????? ????:  ???????????? ?????????.  ????????????????????? ?????????.  ?????????????? ????????.  ????????????? ???????? (??????????????? ?????????). ???? ???? ???????? ?? ????, ??? ????? ????????? ??? ?????????? ? ???????????, ????????, ? ???????? ????? (????????????) ??? ? ???-????? (??????????????), ????? ???????? ????????? ??????, ????????? ??????????? ???? ??? ????????????. ? ???????? ???????? ???????? ????? ???????????: ????????????? ?????????  ?????????? ?????????????? ? ??????????? ????????????? ????????? ?????? ? ???????????? ? ?????????? ?????.  ?? ?????????? ??????? ????? ???????? ???? ??? ???? ?? ????, ?????? ??????????? ?????? ?????. ?????????? ???? ? ?????????? ????????   ????????????? ???????? ??????? ???????? (???????? ?????????) ? ????? ????????? ??? ? ?????????, ??? ??????? ????? ??????.  ?????????? ????? ????? ???????? ????????????? ?????????. ????? ????????? ???, ?????????? ???????? ??????? ???? ??? ??????? ? ???? ??? ???????. ?? ??????? ???????? ? ??????? ????. ???? ?? ?????, ?????????? ?????????? ? ?????  ???????? ?? ??????? ???? ? ??????????? ????????? ??????? ?? ????? ??? ????? (??????). ?????? ????????? ??????, ????? ????????? ??????????? ???? ?????????? ????????????? ? ?????.  ???? ? ?????????????? ???????? ?????????? ????????? ???????? ????? ?? ?????? ??????? ?????????, ??? ???????? ????????? ?????? ?????.  ?????????? ???? ?????? ???? ?? ??????? ????????? ????????. ?????????? ?? ???????: ? ???????? ???????????, ????? ??? ????. ? ????????? ???????? ??? ?????. ? ???????? ????????? ???????????. ? ?????????  ???? ??? ????????? ??????????? ??????.  ?? ??????? ??????????? ???????? ???? ??? ??????????? ????????. ?? ????????? ????????: ? ??????? ????? ??????? ? ??????? ??????????????. ? ???? ?? ??????? ???????? ????????, ?????????? ???????? ?? ????? ??? ?????? ???????? ??? ????????. ????? ????????  ????????? ??? ??????? ????? ??????? ??????.  ????????? ?? ??? ??????????? ?????? ? ???????????? ? ?????????? ?????. ??? ?????.  ????? ????? ???? ? ?????. ???? ???? ? ???? ??????????, ??????????? ??????????????? ???????? ??? ???. ??? ??????? ??????????? ???????? ? ??? ????????????? ???????? ????.  ?? ????, ??? ??????? ?? ????????? ?? ???????, ??????????? ????? ???????? ????? ??????? ???????? ???? ? ?????, ??????? ??????? ?? ?? ?????? ??? ?? ???? ????????????? ?????? ???. ????? ????? ?? ?????????, ?? ????????????? ? ??????? ??????, ???????? ?: ? ??????????? ??????. ? ??????????? ?????????. ? ?????? ? ???????. ? ???????? ??????, ??????? ?????????? ??????. ? ??????, ??????????? ???????????? ?????????????, ????????, ???????? ???????? ??? ??????. ?????????? ? ?????, ????:  ???? ?????????? ????? ??????? ??????????? ????????.  ???? ?? ???????? ????? ???????????? ????.  ? ??? ????????? ???????? ???????? ? ??????? ????, ????? ???: ? ??????????? ???????????, ????? ??? ???? ? ??????? ????. ? ????????? ???????? ??? ????? ?? ????????? ????. ? ?????????? ??????? ?????????? ?????? ?? ?????. ? ?? ????? ??????????? ???? ?????????? ???? ??? ?????????? ?????. ?????????? ?????????? ?? ???????, ????:  ???? ????????? ?? ???? ??? ????.  ? ??? ???????? ???? ?? ????, ????? ???? ??? ?? ???????? ???????????????? ?? ????? ???????? ????.  ? ??? ????????? ???.  ????????? ???? ? ??? ??? ???? ? ????.  ? ??? ??????? ???????? ????????.  ???? ????????? ??????????. ??????  ???????????? ??????, ????????? ????? ??? ????????? ???????????? ?????, - ??? ????????, ??????? ?????????????? ?????????? ?? ???? ??????????? ???? ?  ??????????, ???????????? ?????????.  ??? ??????????? ????????????? ??? ???????????? ????. ??? ????????????? ???????? ????? ????? ??????? ???? ??? ???????? ?? ????????? ? ??????????? ??.  ???????? ?? ??????? ???? ? ??????????? ????????? ??????? ?? ????? ??? ????? (??????). ?????? ????????? ??????, ????? ????????? ??????????? ???? ?????????? ????????????? ? ?????.  ?? ????, ??? ??????? ?? ????????? ?? ???????, ??????????? ????? ???????? ????? ??????? ???????? ???? ? ?????, ??????? ??????? ?? ?? ?????? ??? ?? ???? ????????????? ?????? ???. ??? ?????????? ?? ????? ???????? ??????, ??????????????? ????? ??????. ??????????? ???????? ????? ???????????? ??? ??????? ? ????? ??????? ??????.  Document Revised: 06/13/2017 Document Reviewed: 06/13/2017 Elsevier Patient Education  2020 [employer].

## 2020-03-23 NOTE — Progress Notes (Signed)
Subjective:  Patient ID: Jessica Rowland, female    DOB: 1932/06/21  Age: 86 y.o. MRN: [id number]  CC: No chief complaint on file.   HPI Jessica Rowland presents for LBP - better C/o throbbing episodic  R ear pain x1 month - it reoccurred  every summer - air conditioner, had rash on the R ear. C/o thoracic back pain  Outpatient Medications Prior to Visit  Medication Sig Dispense Refill  . acyclovir (ZOVIRAX) 400 MG tablet TAKE ONE TABLET BY MOUTH THREE TIMES A DAY 21 tablet 1  . aspirin EC 81 MG tablet Take 81 mg by mouth daily.     Marland Kitchen b complex vitamins tablet Take 1 tablet by mouth daily. 100 tablet 3  . Cholecalciferol (VITAMIN D3) 2000 units capsule Take 1 capsule (2,000 Units total) by mouth daily. 100 capsule 3  . diphenhydrAMINE (BENADRYL ALLERGY) 25 MG tablet Take 0.5-1 tablets (12.5-25 mg total) by mouth at bedtime as needed for sleep. 60 tablet 3  . Ginkgo Biloba 120 MG CAPS Take 120 mg by mouth 3 (three) times daily.    . Omega-3 Fatty Acids (FISH OIL) 1000 MG CAPS Take 1,000 mg by mouth 2 (two) times daily.     No facility-administered medications prior to visit.    ROS: Review of Systems  Constitutional: Negative for activity change, appetite change, chills, fatigue and unexpected weight change.  HENT: Negative for congestion, mouth sores and sinus pressure.   Eyes: Negative for visual disturbance.  Respiratory: Negative for cough and chest tightness.   Gastrointestinal: Negative for abdominal pain and nausea.  Genitourinary: Negative for difficulty urinating, frequency and vaginal pain.  Musculoskeletal: Positive for back pain. Negative for gait problem.  Skin: Positive for rash. Negative for pallor.  Neurological: Negative for dizziness, tremors, weakness, numbness and headaches.  Psychiatric/Behavioral: Negative for confusion and sleep disturbance.    Objective:  BP 140/70 (BP Location: Left Arm, Patient Position: Sitting, Cuff Size: Small)   Pulse 84   Temp  98.1 F (36.7 C) (Oral)   Ht 5\' 1"  (1.549 m)   Wt 97 lb (44 kg)   SpO2 98%   BMI 18.33 kg/m   BP Readings from Last 3 Encounters:  03/23/20 140/70  03/01/20 (!) 150/60  01/14/20 132/70    Wt Readings from Last 3 Encounters:  03/23/20 97 lb (44 kg)  03/01/20 98 lb (44.5 kg)  01/14/20 101 lb 9.6 oz (46.1 kg)    Physical Exam Constitutional:      General: She is not in acute distress.    Appearance: She is well-developed.  HENT:     Head: Normocephalic.     Right Ear: External ear normal.     Left Ear: External ear normal.     Nose: Nose normal.  Eyes:     General:        Right eye: No discharge.        Left eye: No discharge.     Conjunctiva/sclera: Conjunctivae normal.     Pupils: Pupils are equal, round, and reactive to light.  Neck:     Thyroid: No thyromegaly.     Vascular: No JVD.     Trachea: No tracheal deviation.  Cardiovascular:     Rate and Rhythm: Normal rate and regular rhythm.     Heart sounds: Normal heart sounds.  Pulmonary:     Effort: No respiratory distress.     Breath sounds: No stridor. No wheezing.  Abdominal:     General: Bowel  sounds are normal. There is no distension.     Palpations: Abdomen is soft. There is no mass.     Tenderness: There is no abdominal tenderness. There is no guarding or rebound.  Musculoskeletal:        General: No tenderness.     Cervical back: Normal range of motion and neck supple.  Lymphadenopathy:     Cervical: No cervical adenopathy.  Skin:    Findings: Rash present. No erythema.  Neurological:     Cranial Nerves: No cranial nerve deficit.     Motor: No abnormal muscle tone.     Coordination: Coordination normal.     Deep Tendon Reflexes: Reflexes normal.  Psychiatric:        Behavior: Behavior normal.        Thought Content: Thought content normal.        Judgment: Judgment normal.   thor back - sensitive R ear w/scabbs  Lab Results  Component Value Date   WBC 8.3 01/22/2019   HGB 14.6 01/22/2019    HCT 43.2 01/22/2019   PLT 275.0 01/22/2019   GLUCOSE 111 (H) 01/22/2019   ALT 16 01/22/2019   AST 14 01/22/2019   NA 133 (L) 01/22/2019   K 4.4 01/22/2019   CL 98 01/22/2019   CREATININE 0.60 01/22/2019   BUN 14 01/22/2019   CO2 26 01/22/2019   TSH 1.21 01/22/2019    VAS [place] LOWER EXTREMITY VENOUS REFLUX  Result Date: 02/29/2020  Lower Venous Reflux Study Indications: Varicosities.  Performing Technologist: Ralene Cork RVT  Examination Guidelines: A complete evaluation includes B-mode imaging, spectral Doppler, color Doppler, and power Doppler as needed of all accessible portions of each vessel. Bilateral testing is considered an integral part of a complete examination. Limited examinations for reoccurring indications may be performed as noted. The reflux portion of the exam is performed with the patient in reverse Trendelenburg. Significant venous reflux is defined as >500 ms in the superficial venous system, and >1 second in the deep venous system.  Venous Reflux Times +--------------+---------+------+-----------+------------+---------------------+ RIGHT         Reflux NoRefluxReflux TimeDiameter cmsComments                                      Yes                                               +--------------+---------+------+-----------+------------+---------------------+ CFV                     yes   >1 second                                   +--------------+---------+------+-----------+------------+---------------------+ FV mid        no                                                          +--------------+---------+------+-----------+------------+---------------------+ Popliteal               yes   >1 second                                   +--------------+---------+------+-----------+------------+---------------------+  GSV at [facility]              yes    >500 ms     0.875                           +--------------+---------+------+-----------+------------+---------------------+ GSV prox thigh          yes    >500 ms     0.514                          +--------------+---------+------+-----------+------------+---------------------+ GSV mid thigh no                           0.135    branch out of fascia                                                      >579ms                +--------------+---------+------+-----------+------------+---------------------+ GSV dist thighno                           0.162                          +--------------+---------+------+-----------+------------+---------------------+ GSV at knee   no                           0.481                          +--------------+---------+------+-----------+------------+---------------------+ GSV prox calf           yes    >500 ms     0.401                          +--------------+---------+------+-----------+------------+---------------------+ SSV Pop Fossa no                           0.298                          +--------------+---------+------+-----------+------------+---------------------+ SSV prox calf                  >500 ms      0.22                          +--------------+---------+------+-----------+------------+---------------------+ SSV mid calf            yes    >500 ms     0.131                          +--------------+---------+------+-----------+------------+---------------------+  +--------------+---------+------+-----------+------------+--------+ LEFT          Reflux NoRefluxReflux TimeDiameter cmsComments                         Yes                                  +--------------+---------+------+-----------+------------+--------+  CFV                     yes   >1 second                      +--------------+---------+------+-----------+------------+--------+ FV mid                  yes   >1 second                       +--------------+---------+------+-----------+------------+--------+ Popliteal               yes   >1 second                      +--------------+---------+------+-----------+------------+--------+ GSV at SFJ              yes    >500 ms     0.447             +--------------+---------+------+-----------+------------+--------+ GSV prox thigh          yes    >500 ms     0.301             +--------------+---------+------+-----------+------------+--------+ GSV mid thigh           yes    >500 ms     0.266             +--------------+---------+------+-----------+------------+--------+ GSV dist thighno                           0.266             +--------------+---------+------+-----------+------------+--------+ GSV at knee   no                           0.274             +--------------+---------+------+-----------+------------+--------+ GSV prox calf                              0.155             +--------------+---------+------+-----------+------------+--------+ SSV Pop Fossa no                           0.228             +--------------+---------+------+-----------+------------+--------+ SSV prox calf no                           0.224             +--------------+---------+------+-----------+------------+--------+ SSV mid calf  no                           0.195             +--------------+---------+------+-----------+------------+--------+  Summary: Right: - No evidence of deep vein thrombosis seen in the right lower extremity, from the common femoral through the popliteal veins. - No evidence of superficial venous thrombosis in the right lower extremity. - Deep vein reflux in the CFV and popliteal vein. - Superficial vein reflux in the SSV, SFJ, and GSV as above.  Left: - No evidence of deep vein thrombosis seen in the left lower extremity, from the common femoral through the popliteal veins. -  No evidence of superficial venous thrombosis in the left  lower extremity.  - Deep vein reflux in the CFV, FV, and popliteal vein. - Superficial vein reflux in the SFJ, and GSV as above  *See table(s) above for measurements and observations. Electronically signed by Curt Jews MD on 02/29/2020 at 11:48:04 AM.    Final     Assessment & Plan:   There are no diagnoses linked to this encounter.   No orders of the defined types were placed in this encounter.    Follow-up: No follow-ups on file.  Walker Kehr, MD

## 2020-03-23 NOTE — Assessment & Plan Note (Signed)
R ear Acyclovir

## 2020-03-23 NOTE — Assessment & Plan Note (Signed)
Chronic - worse Thor spine X ray

## 2020-03-26 ENCOUNTER — Encounter: Payer: Self-pay | Admitting: Internal Medicine

## 2020-03-26 DIAGNOSIS — M810 Age-related osteoporosis without current pathological fracture: Secondary | ICD-10-CM | POA: Insufficient documentation

## 2020-05-04 ENCOUNTER — Ambulatory Visit (INDEPENDENT_AMBULATORY_CARE_PROVIDER_SITE_OTHER): Payer: Medicare Other | Admitting: Internal Medicine

## 2020-05-04 ENCOUNTER — Other Ambulatory Visit: Payer: Self-pay

## 2020-05-04 ENCOUNTER — Encounter: Payer: Self-pay | Admitting: Internal Medicine

## 2020-05-04 VITALS — BP 144/86 | HR 70 | Temp 98.0°F | Ht 61.0 in | Wt 99.0 lb

## 2020-05-04 DIAGNOSIS — Z23 Encounter for immunization: Secondary | ICD-10-CM

## 2020-05-04 DIAGNOSIS — G4700 Insomnia, unspecified: Secondary | ICD-10-CM | POA: Diagnosis not present

## 2020-05-04 DIAGNOSIS — B029 Zoster without complications: Secondary | ICD-10-CM

## 2020-05-04 DIAGNOSIS — R5383 Other fatigue: Secondary | ICD-10-CM | POA: Diagnosis not present

## 2020-05-04 DIAGNOSIS — G5701 Lesion of sciatic nerve, right lower limb: Secondary | ICD-10-CM

## 2020-05-04 MED ORDER — SHINGRIX 50 MCG/0.5ML IM SUSR: 0.5000 mL | Freq: Once | INTRAMUSCULAR | 1 refills | Status: AC

## 2020-05-04 NOTE — Assessment & Plan Note (Signed)
PT offered 

## 2020-05-04 NOTE — Assessment & Plan Note (Addendum)
No relapse Shingrix vaccine - start in 3 months

## 2020-05-04 NOTE — Assessment & Plan Note (Signed)
Doing better.   

## 2020-05-04 NOTE — Patient Instructions (Signed)
Hearing Aids at [facility]

## 2020-05-04 NOTE — Progress Notes (Signed)
Subjective:  Patient ID: Jessica Rowland, female    DOB: [birth year]  Age: 86 y.o. MRN: [id number]  CC: No chief complaint on file.   HPI Jessica Rowland presents for hearing loss, LBP - stable, elevated BP f/u  Outpatient Medications Prior to Visit  Medication Sig Dispense Refill  . acyclovir (ZOVIRAX) 400 MG tablet Take 2 tablets (800 mg total) by mouth 4 (four) times daily. For shingles 56 tablet 1  . acyclovir (ZOVIRAX) 400 MG tablet Take 1 tablet (400 mg total) by mouth 3 (three) times daily. PRN cold sores 21 tablet 3  . aspirin EC 81 MG tablet Take 81 mg by mouth daily.     Marland Kitchen b complex vitamins tablet Take 1 tablet by mouth daily. 100 tablet 3  . Cholecalciferol (VITAMIN D3) 2000 units capsule Take 1 capsule (2,000 Units total) by mouth daily. 100 capsule 3  . diphenhydrAMINE (BENADRYL ALLERGY) 25 MG tablet Take 0.5-1 tablets (12.5-25 mg total) by mouth at bedtime as needed for sleep. 60 tablet 3  . Ginkgo Biloba 120 MG CAPS Take 120 mg by mouth 3 (three) times daily.    Marland Kitchen ibuprofen (ADVIL) 400 MG tablet Take 1 tablet (400 mg total) by mouth every 8 (eight) hours as needed (ear pain). 60 tablet 1  . Omega-3 Fatty Acids (FISH OIL) 1000 MG CAPS Take 1,000 mg by mouth 2 (two) times daily.    Marland Kitchen triamcinolone cream (KENALOG) 0.5 % Apply 1 application topically 3 (three) times daily. 45 g 1   No facility-administered medications prior to visit.    ROS: Review of Systems  Constitutional: Negative for activity change, appetite change, chills, fatigue and unexpected weight change.  HENT: Positive for hearing loss. Negative for congestion, mouth sores and sinus pressure.   Eyes: Negative for visual disturbance.  Respiratory: Negative for cough and chest tightness.   Gastrointestinal: Negative for abdominal pain and nausea.  Genitourinary: Negative for difficulty urinating, frequency and vaginal pain.  Musculoskeletal: Positive for back pain. Negative for gait problem.  Skin: Negative  for pallor and rash.  Neurological: Negative for dizziness, tremors, weakness, numbness and headaches.  Psychiatric/Behavioral: Negative for confusion and sleep disturbance.    Objective:  BP (!) 144/86   Pulse 70   Temp 98 F (36.7 C) (Oral)   Ht 5\' 1"  (1.549 m)   Wt 99 lb (44.9 kg)   SpO2 98%   BMI 18.71 kg/m   BP Readings from Last 3 Encounters:  05/04/20 (!) 144/86  03/23/20 140/70  03/01/20 (!) 150/60    Wt Readings from Last 3 Encounters:  05/04/20 99 lb (44.9 kg)  03/23/20 97 lb (44 kg)  03/01/20 98 lb (44.5 kg)    Physical Exam Constitutional:      General: She is not in acute distress.    Appearance: Normal appearance. She is well-developed.  HENT:     Head: Normocephalic.     Right Ear: External ear normal.     Left Ear: External ear normal.     Nose: Nose normal.  Eyes:     General:        Right eye: No discharge.        Left eye: No discharge.     Conjunctiva/sclera: Conjunctivae normal.     Pupils: Pupils are equal, round, and reactive to light.  Neck:     Thyroid: No thyromegaly.     Vascular: No JVD.     Trachea: No tracheal deviation.  Cardiovascular:  Rate and Rhythm: Normal rate and regular rhythm.     Heart sounds: Normal heart sounds.  Pulmonary:     Effort: No respiratory distress.     Breath sounds: No stridor. No wheezing.  Abdominal:     General: Bowel sounds are normal. There is no distension.     Palpations: Abdomen is soft. There is no mass.     Tenderness: There is no abdominal tenderness. There is no guarding or rebound.  Musculoskeletal:        General: No tenderness.     Cervical back: Normal range of motion and neck supple.  Lymphadenopathy:     Cervical: No cervical adenopathy.  Skin:    Findings: No erythema or rash.  Neurological:     Cranial Nerves: No cranial nerve deficit.     Motor: No abnormal muscle tone.     Coordination: Coordination normal.     Deep Tendon Reflexes: Reflexes normal.  Psychiatric:         Behavior: Behavior normal.        Thought Content: Thought content normal.        Judgment: Judgment normal.     Lab Results  Component Value Date   WBC 8.3 01/22/2019   HGB 14.6 01/22/2019   HCT 43.2 01/22/2019   PLT 275.0 01/22/2019   GLUCOSE 111 (H) 01/22/2019   ALT 16 01/22/2019   AST 14 01/22/2019   NA 133 (L) 01/22/2019   K 4.4 01/22/2019   CL 98 01/22/2019   CREATININE 0.60 01/22/2019   BUN 14 01/22/2019   CO2 26 01/22/2019   TSH 1.21 01/22/2019    VAS [place] LOWER EXTREMITY VENOUS REFLUX  Result Date: 02/29/2020  Lower Venous Reflux Study Indications: Varicosities.  Performing Technologist: Ralene Cork RVT  Examination Guidelines: A complete evaluation includes B-mode imaging, spectral Doppler, color Doppler, and power Doppler as needed of all accessible portions of each vessel. Bilateral testing is considered an integral part of a complete examination. Limited examinations for reoccurring indications may be performed as noted. The reflux portion of the exam is performed with the patient in reverse Trendelenburg. Significant venous reflux is defined as >500 ms in the superficial venous system, and >1 second in the deep venous system.  Venous Reflux Times +--------------+---------+------+-----------+------------+---------------------+ RIGHT         Reflux NoRefluxReflux TimeDiameter cmsComments                                      Yes                                               +--------------+---------+------+-----------+------------+---------------------+ CFV                     yes   >1 second                                   +--------------+---------+------+-----------+------------+---------------------+ FV mid        no                                                          +--------------+---------+------+-----------+------------+---------------------+  Popliteal               yes   >1 second                                    +--------------+---------+------+-----------+------------+---------------------+ GSV at [facility]              yes    >500 ms     0.875                          +--------------+---------+------+-----------+------------+---------------------+ GSV prox thigh          yes    >500 ms     0.514                          +--------------+---------+------+-----------+------------+---------------------+ GSV mid thigh no                           0.135    branch out of fascia                                                      >5103ms                +--------------+---------+------+-----------+------------+---------------------+ GSV dist thighno                           0.162                          +--------------+---------+------+-----------+------------+---------------------+ GSV at knee   no                           0.481                          +--------------+---------+------+-----------+------------+---------------------+ GSV prox calf           yes    >500 ms     0.401                          +--------------+---------+------+-----------+------------+---------------------+ SSV Pop Fossa no                           0.298                          +--------------+---------+------+-----------+------------+---------------------+ SSV prox calf                  >500 ms      0.22                          +--------------+---------+------+-----------+------------+---------------------+ SSV mid calf            yes    >500 ms     0.131                          +--------------+---------+------+-----------+------------+---------------------+  +--------------+---------+------+-----------+------------+--------+ LEFT  Reflux NoRefluxReflux TimeDiameter cmsComments                         Yes                                  +--------------+---------+------+-----------+------------+--------+ CFV                     yes   >1 second                       +--------------+---------+------+-----------+------------+--------+ FV mid                  yes   >1 second                      +--------------+---------+------+-----------+------------+--------+ Popliteal               yes   >1 second                      +--------------+---------+------+-----------+------------+--------+ GSV at SFJ              yes    >500 ms     0.447             +--------------+---------+------+-----------+------------+--------+ GSV prox thigh          yes    >500 ms     0.301             +--------------+---------+------+-----------+------------+--------+ GSV mid thigh           yes    >500 ms     0.266             +--------------+---------+------+-----------+------------+--------+ GSV dist thighno                           0.266             +--------------+---------+------+-----------+------------+--------+ GSV at knee   no                           0.274             +--------------+---------+------+-----------+------------+--------+ GSV prox calf                              0.155             +--------------+---------+------+-----------+------------+--------+ SSV Pop Fossa no                           0.228             +--------------+---------+------+-----------+------------+--------+ SSV prox calf no                           0.224             +--------------+---------+------+-----------+------------+--------+ SSV mid calf  no                           0.195             +--------------+---------+------+-----------+------------+--------+  Summary: Right: - No evidence of deep vein thrombosis seen in the right lower extremity, from the common  femoral through the popliteal veins. - No evidence of superficial venous thrombosis in the right lower extremity. - Deep vein reflux in the CFV and popliteal vein. - Superficial vein reflux in the SSV, SFJ, and GSV as above.  Left: - No evidence of deep vein  thrombosis seen in the left lower extremity, from the common femoral through the popliteal veins. - No evidence of superficial venous thrombosis in the left lower extremity.  - Deep vein reflux in the CFV, FV, and popliteal vein. - Superficial vein reflux in the SFJ, and GSV as above  *See table(s) above for measurements and observations. Electronically signed by Curt Jews MD on 02/29/2020 at 11:48:04 AM.    Final     Assessment & Plan:    Walker Kehr, MD

## 2020-05-05 LAB — COMPLETE METABOLIC PANEL WITH GFR
AG Ratio: 1.8 (calc) (ref 1.0–2.5)
ALT: 16 U/L (ref 6–29)
AST: 18 U/L (ref 10–35)
Albumin: 4.2 g/dL (ref 3.6–5.1)
Alkaline phosphatase (APISO): 51 U/L (ref 37–153)
BUN/Creatinine Ratio: 20 (calc) (ref 6–22)
BUN: 10 mg/dL (ref 7–25)
CO2: 21 mmol/L (ref 20–32)
Calcium: 9.1 mg/dL (ref 8.6–10.4)
Chloride: 96 mmol/L — ABNORMAL LOW (ref 98–110)
Creat: 0.49 mg/dL — ABNORMAL LOW (ref 0.60–0.88)
GFR, Est African American: 103 mL/min/{1.73_m2} (ref >=60)
GFR, Est Non African American: 89 mL/min/{1.73_m2} (ref >=60)
Globulin: 2.4 g/dL (calc) (ref 1.9–3.7)
Glucose, Bld: 91 mg/dL (ref 65–99)
Potassium: 4.1 mmol/L (ref 3.5–5.3)
Sodium: 131 mmol/L — ABNORMAL LOW (ref 135–146)
Total Bilirubin: 0.6 mg/dL (ref 0.2–1.2)
Total Protein: 6.6 g/dL (ref 6.1–8.1)

## 2020-05-05 LAB — URINALYSIS
Bilirubin Urine: NEGATIVE
Glucose, UA: NEGATIVE
Hgb urine dipstick: NEGATIVE
Ketones, ur: NEGATIVE
Leukocytes,Ua: NEGATIVE
Nitrite: NEGATIVE
Protein, ur: NEGATIVE
Specific Gravity, Urine: 1.005 (ref 1.001–1.03)
pH: 7 (ref 5.0–8.0)

## 2020-05-05 LAB — CBC WITH DIFFERENTIAL/PLATELET
Absolute Monocytes: 440 cells/uL (ref 200–950)
Basophils Absolute: 50 cells/uL (ref 0–200)
Basophils Relative: 0.8 %
Eosinophils Absolute: 31 cells/uL (ref 15–500)
Eosinophils Relative: 0.5 %
HCT: 40.7 % (ref 35.0–45.0)
Hemoglobin: 13.7 g/dL (ref 11.7–15.5)
Lymphs Abs: 1600 cells/uL (ref 850–3900)
MCH: 30.9 pg (ref 27.0–33.0)
MCHC: 33.7 g/dL (ref 32.0–36.0)
MCV: 91.9 fL (ref 80.0–100.0)
MPV: 10.3 fL (ref 7.5–12.5)
Monocytes Relative: 7.1 %
Neutro Abs: 4080 cells/uL (ref 1500–7800)
Neutrophils Relative %: 65.8 %
Platelets: 279 10*3/uL (ref 140–400)
RBC: 4.43 10*6/uL (ref 3.80–5.10)
RDW: 12.3 % (ref 11.0–15.0)
Total Lymphocyte: 25.8 %
WBC: 6.2 10*3/uL (ref 3.8–10.8)

## 2020-05-05 LAB — TSH: TSH: 1.21 mIU/L (ref 0.40–4.50)

## 2020-06-07 ENCOUNTER — Ambulatory Visit (INDEPENDENT_AMBULATORY_CARE_PROVIDER_SITE_OTHER): Payer: Medicare Other | Admitting: Vascular Surgery

## 2020-06-07 ENCOUNTER — Other Ambulatory Visit: Payer: Self-pay

## 2020-06-07 ENCOUNTER — Other Ambulatory Visit: Payer: Self-pay | Admitting: *Deleted

## 2020-06-07 ENCOUNTER — Encounter: Payer: Self-pay | Admitting: Vascular Surgery

## 2020-06-07 VITALS — BP 174/61 | HR 79 | Temp 97.5°F | Resp 14 | Ht 60.0 in | Wt 100.0 lb

## 2020-06-07 DIAGNOSIS — I83811 Varicose veins of right lower extremities with pain: Secondary | ICD-10-CM

## 2020-06-07 NOTE — Progress Notes (Signed)
Patient is an 87 year old female who returns for follow-up today.  She was last seen July of this year for symptomatic varicose veins in her right leg.  She has some aching pain that occurs in her right leg.  She gets relief of her symptoms by elevating her legs.  She states she did not really improve her symptoms much with wearing compression stockings.  She does have varicose veins in the left leg but these are not troublesome to her currently.  Review of systems: She has no shortness of breath.  She has no chest pain.  She is overall very active.  Physical exam:  Vitals:   06/07/20 1337  BP: (!) 174/61  Pulse: 79  Resp: 14  Temp: (!) 97.5 F (36.4 C)  TempSrc: Temporal  SpO2: 100%  Weight: 100 lb (45.4 kg)  Height: 5' (1.524 m)    Multiple clusters of varicosities primarily right medial calf.  Pictures were taken of her legs at her last office visit July 21.  2+ dorsalis pedis posterior tibial pulse  Data: Patient's previous venous reflux exam showed a dilated right greater saphenous vein to 5 mm throughout its course with a narrowed mid segment of about 1 mm and branching in this area.  Previous duplex only showed 3 mm diameter greater saphenous vein on the left side.  Assessment: Symptomatic varicose veins right leg with pain not improved with compression stockings.  Also very close to the surface and at risk for bleeding.  She does have one narrowed area in the middle of her regular saphenous vein so would potentially need to puncture above this or do 2 separate sticks.  She will then need greater than 20 stab avulsions.  Interview today was conducted through a [employer] provided video interpreter.  The patient speaks [place].  Plan: Laser ablation right greater saphenous vein with greater than 20 stab avulsions in the near future pending insurance approval.  Ruta Hinds, MD [facility] Office: [phone]

## 2020-06-08 ENCOUNTER — Ambulatory Visit: Payer: Medicare Other | Admitting: Vascular Surgery

## 2020-06-13 DIAGNOSIS — Z23 Encounter for immunization: Secondary | ICD-10-CM | POA: Diagnosis not present

## 2020-06-21 ENCOUNTER — Encounter: Payer: Self-pay | Admitting: Vascular Surgery

## 2020-06-21 ENCOUNTER — Ambulatory Visit (INDEPENDENT_AMBULATORY_CARE_PROVIDER_SITE_OTHER): Payer: Medicare Other | Admitting: Vascular Surgery

## 2020-06-21 ENCOUNTER — Other Ambulatory Visit: Payer: Self-pay

## 2020-06-21 VITALS — BP 159/69 | HR 81 | Temp 97.5°F | Resp 18 | Ht 60.0 in | Wt 100.0 lb

## 2020-06-21 DIAGNOSIS — I83811 Varicose veins of right lower extremities with pain: Secondary | ICD-10-CM | POA: Diagnosis not present

## 2020-06-21 HISTORY — PX: ENDOVENOUS ABLATION SAPHENOUS VEIN W/ LASER: SUR449

## 2020-06-21 NOTE — Progress Notes (Signed)
     Laser Ablation Procedure    Date: 06/21/2020   Jessica Rowland DOB:14-Nov-1931  Consent signed: Yes      Surgeon: Ruta Hinds MD   Procedure: Laser Ablation: right Greater Saphenous Vein  BP (!) 159/69 (BP Location: Left Arm, Patient Position: Sitting, Cuff Size: Small)   Pulse 81   Temp (!) 97.5 F (36.4 C) (Temporal)   Resp 18   Ht 5' (1.524 m)   Wt 100 lb (45.4 kg)   SpO2 99%   BMI 19.53 kg/m   Tumescent Anesthesia: 450 cc 0.9% NaCl with 50 cc Lidocaine HCL 1%  and 15 cc 8.4% NaHCO3  Local Anesthesia: 9 cc Lidocaine HCL and NaHCO3 (ratio 2:1)  7 watts continuous mode     Total energy: 940 Joules     Total time: 134 seconds Treatment Length  20 cm  Laser Fiber Ref. # [id number]  Lot # [id number]                          Stab Phlebectomy: >20 Sites: Calf and Ankle  Patient tolerated procedure well  Notes: Patient wore face mask.  All staff members wore facial masks and facial shields/goggles.  Interpreter present in procedural room before,during, and after procedure to translate for patient.    Description of Procedure:  After marking the course of the secondary varicosities, the patient was placed on the operating table in the supine position, and the right leg was prepped and draped in sterile fashion.   Local anesthetic was administered and under ultrasound guidance the saphenous vein was accessed with a micro needle and guide wire; then the mirco puncture sheath was placed.  A guide wire was inserted saphenofemoral junction , followed by a 5 french sheath.  The position of the sheath and then the laser fiber below the junction was confirmed using the ultrasound.  Tumescent anesthesia was administered along the course of the saphenous vein using ultrasound guidance. The patient was placed in Trendelenburg position and protective laser glasses were placed on patient and staff, and the laser was fired at 7 watts continuous mode for a total of 940 joules.   For stab  phlebectomies, local anesthetic was administered at the previously marked varicosities, and tumescent anesthesia was administered around the vessels.  Greater than 20 stab wounds were made using the tip of an 11 blade. And using the vein hook, the phlebectomies were performed using a hemostat to avulse the varicosities.  Adequate hemostasis was achieved.     Steri strips were applied to the stab wounds and ABD pads and thigh high compression stockings were applied.  Ace wrap bandages were applied over the phlebectomy sites and at the top of the saphenofemoral junction. Blood loss was less than 15 cc.  Discharge instructions reviewed with patient and hardcopy of discharge instructions given to patient to take home. The patient ambulated out of the operating room having tolerated the procedure well.  Ruta Hinds, MD [facility] Office: [phone]

## 2020-06-28 ENCOUNTER — Ambulatory Visit (INDEPENDENT_AMBULATORY_CARE_PROVIDER_SITE_OTHER): Payer: Self-pay | Admitting: Vascular Surgery

## 2020-06-28 ENCOUNTER — Ambulatory Visit (HOSPITAL_COMMUNITY)
Admission: RE | Admit: 2020-06-28 | Discharge: 2020-06-28 | Disposition: A | Discharge status: Home / Self Care | Payer: Medicare Other | Source: Ambulatory Visit | Attending: Vascular Surgery | Admitting: Vascular Surgery

## 2020-06-28 ENCOUNTER — Other Ambulatory Visit: Payer: Self-pay

## 2020-06-28 ENCOUNTER — Encounter: Payer: Self-pay | Admitting: Vascular Surgery

## 2020-06-28 VITALS — BP 152/59 | HR 69 | Temp 97.5°F | Resp 14

## 2020-06-28 DIAGNOSIS — I83811 Varicose veins of right lower extremities with pain: Secondary | ICD-10-CM | POA: Diagnosis not present

## 2020-06-28 DIAGNOSIS — I8393 Asymptomatic varicose veins of bilateral lower extremities: Secondary | ICD-10-CM

## 2020-06-28 NOTE — Progress Notes (Signed)
Patient is an 86 year old female who returns for postoperative follow-up today.  She recently underwent laser ablation and stab avulsions of varicose veins in her right leg.  She has no shortness of breath or chest pain.  She reports no significant swelling in her leg.  Her pain has resolved.  She is still continuing to wear her compression stocking.  Physical exam:  Vitals:   06/28/20 1026  BP: (!) 152/59  Pulse: 69  Resp: 14  Temp: (!) 97.5 F (36.4 C)  TempSrc: Temporal  SpO2: 100%    Extremities: 2+ dorsalis pedis pulse right foot, healing stab incisions no significant tenderness over the right medial thigh  Data: Patient had a duplex ultrasound today which shows good closure of the saphenous vein within 6 mm of the saphenofemoral junction no DVT  Assessment: Doing well status post laser ablation stab avulsions right leg for varicose veins.  Plan: Patient will follow up on an as-needed basis.  She will continue to wear her compression stockings.  Ruta Hinds, MD [facility] [place] Office: [phone]

## 2020-07-11 ENCOUNTER — Encounter: Payer: Self-pay | Admitting: Vascular Surgery

## 2020-11-02 DIAGNOSIS — H353132 Nonexudative age-related macular degeneration, bilateral, intermediate dry stage: Secondary | ICD-10-CM | POA: Diagnosis not present

## 2020-11-02 DIAGNOSIS — H2589 Other age-related cataract: Secondary | ICD-10-CM | POA: Diagnosis not present

## 2020-11-02 DIAGNOSIS — H40023 Open angle with borderline findings, high risk, bilateral: Secondary | ICD-10-CM | POA: Diagnosis not present

## 2020-11-02 DIAGNOSIS — H35343 Macular cyst, hole, or pseudohole, bilateral: Secondary | ICD-10-CM | POA: Diagnosis not present

## 2020-11-06 ENCOUNTER — Encounter (INDEPENDENT_AMBULATORY_CARE_PROVIDER_SITE_OTHER): Payer: Medicare Other | Admitting: Ophthalmology

## 2020-11-06 ENCOUNTER — Other Ambulatory Visit: Payer: Self-pay

## 2020-11-06 DIAGNOSIS — H43813 Vitreous degeneration, bilateral: Secondary | ICD-10-CM | POA: Diagnosis not present

## 2020-11-06 DIAGNOSIS — H59033 Cystoid macular edema following cataract surgery, bilateral: Secondary | ICD-10-CM | POA: Diagnosis not present

## 2020-11-06 DIAGNOSIS — H353132 Nonexudative age-related macular degeneration, bilateral, intermediate dry stage: Secondary | ICD-10-CM

## 2020-12-12 DIAGNOSIS — Z23 Encounter for immunization: Secondary | ICD-10-CM | POA: Diagnosis not present

## 2020-12-18 ENCOUNTER — Encounter (INDEPENDENT_AMBULATORY_CARE_PROVIDER_SITE_OTHER): Payer: Medicare Other | Admitting: Ophthalmology

## 2020-12-18 ENCOUNTER — Other Ambulatory Visit: Payer: Self-pay

## 2020-12-18 DIAGNOSIS — H353132 Nonexudative age-related macular degeneration, bilateral, intermediate dry stage: Secondary | ICD-10-CM | POA: Diagnosis not present

## 2020-12-18 DIAGNOSIS — H43813 Vitreous degeneration, bilateral: Secondary | ICD-10-CM | POA: Diagnosis not present

## 2020-12-18 DIAGNOSIS — H59033 Cystoid macular edema following cataract surgery, bilateral: Secondary | ICD-10-CM | POA: Diagnosis not present

## 2021-01-29 ENCOUNTER — Encounter (INDEPENDENT_AMBULATORY_CARE_PROVIDER_SITE_OTHER): Payer: Medicare Other | Admitting: Ophthalmology

## 2021-01-29 ENCOUNTER — Other Ambulatory Visit: Payer: Self-pay

## 2021-01-29 DIAGNOSIS — H43813 Vitreous degeneration, bilateral: Secondary | ICD-10-CM

## 2021-01-29 DIAGNOSIS — H59033 Cystoid macular edema following cataract surgery, bilateral: Secondary | ICD-10-CM | POA: Diagnosis not present

## 2021-01-29 DIAGNOSIS — H353132 Nonexudative age-related macular degeneration, bilateral, intermediate dry stage: Secondary | ICD-10-CM

## 2021-01-30 ENCOUNTER — Ambulatory Visit: Payer: Medicare Other | Admitting: Internal Medicine

## 2021-03-16 ENCOUNTER — Other Ambulatory Visit: Payer: Self-pay | Admitting: Internal Medicine

## 2021-03-16 ENCOUNTER — Other Ambulatory Visit: Payer: Self-pay

## 2021-03-16 ENCOUNTER — Other Ambulatory Visit (INDEPENDENT_AMBULATORY_CARE_PROVIDER_SITE_OTHER): Payer: Medicare Other

## 2021-03-16 DIAGNOSIS — R5383 Other fatigue: Secondary | ICD-10-CM

## 2021-03-16 DIAGNOSIS — E785 Hyperlipidemia, unspecified: Secondary | ICD-10-CM

## 2021-03-16 LAB — CBC WITH DIFFERENTIAL/PLATELET
Basophils Absolute: 0 10*3/uL (ref 0.0–0.1)
Basophils Relative: 0.6 % (ref 0.0–3.0)
Eosinophils Absolute: 0 10*3/uL (ref 0.0–0.7)
Eosinophils Relative: 0.5 % (ref 0.0–5.0)
HCT: 38.2 % (ref 36.0–46.0)
Hemoglobin: 13.1 g/dL (ref 12.0–15.0)
Lymphocytes Relative: 24.5 % (ref 12.0–46.0)
Lymphs Abs: 1.6 10*3/uL (ref 0.7–4.0)
MCHC: 34.2 g/dL (ref 30.0–36.0)
MCV: 90.9 fl (ref 78.0–100.0)
Monocytes Absolute: 0.5 10*3/uL (ref 0.1–1.0)
Monocytes Relative: 7.8 % (ref 3.0–12.0)
Neutro Abs: 4.3 10*3/uL (ref 1.4–7.7)
Neutrophils Relative %: 66.6 % (ref 43.0–77.0)
Platelets: 234 10*3/uL (ref 150.0–400.0)
RBC: 4.2 Mil/uL (ref 3.87–5.11)
RDW: 13.7 % (ref 11.5–15.5)
WBC: 6.4 10*3/uL (ref 4.0–10.5)

## 2021-03-16 LAB — COMPREHENSIVE METABOLIC PANEL
ALT: 16 U/L (ref 0–35)
AST: 16 U/L (ref 0–37)
Albumin: 4 g/dL (ref 3.5–5.2)
Alkaline Phosphatase: 50 U/L (ref 39–117)
BUN: 16 mg/dL (ref 6–23)
CO2: 27 mEq/L (ref 19–32)
Calcium: 9 mg/dL (ref 8.4–10.5)
Chloride: 95 mEq/L — ABNORMAL LOW (ref 96–112)
Creatinine, Ser: 0.54 mg/dL (ref 0.40–1.20)
GFR: 83.24 mL/min (ref >60.00)
Glucose, Bld: 101 mg/dL — ABNORMAL HIGH (ref 70–99)
Potassium: 4.6 mEq/L (ref 3.5–5.1)
Sodium: 127 mEq/L — ABNORMAL LOW (ref 135–145)
Total Bilirubin: 0.7 mg/dL (ref 0.2–1.2)
Total Protein: 6.4 g/dL (ref 6.0–8.3)

## 2021-03-16 LAB — LIPID PANEL
Cholesterol: 259 mg/dL — ABNORMAL HIGH (ref 0–200)
HDL: 72.5 mg/dL (ref >39.00)
NonHDL: 186.13
Total CHOL/HDL Ratio: 4
Triglycerides: 203 mg/dL — ABNORMAL HIGH (ref 0.0–149.0)
VLDL: 40.6 mg/dL — ABNORMAL HIGH (ref 0.0–40.0)

## 2021-03-16 LAB — TSH: TSH: 1.03 u[IU]/mL (ref 0.35–5.50)

## 2021-03-16 LAB — URINALYSIS
Bilirubin Urine: NEGATIVE
Hgb urine dipstick: NEGATIVE
Leukocytes,Ua: NEGATIVE
Nitrite: NEGATIVE
Specific Gravity, Urine: 1.015 (ref 1.000–1.030)
Total Protein, Urine: NEGATIVE
Urine Glucose: NEGATIVE
Urobilinogen, UA: 0.2 (ref 0.0–1.0)
pH: 6 (ref 5.0–8.0)

## 2021-03-16 LAB — LDL CHOLESTEROL, DIRECT: Direct LDL: 158 mg/dL

## 2021-03-22 ENCOUNTER — Ambulatory Visit: Payer: Medicare Other | Admitting: Internal Medicine

## 2021-03-28 ENCOUNTER — Ambulatory Visit (INDEPENDENT_AMBULATORY_CARE_PROVIDER_SITE_OTHER): Payer: Medicare Other | Admitting: Internal Medicine

## 2021-03-28 ENCOUNTER — Encounter: Payer: Self-pay | Admitting: Internal Medicine

## 2021-03-28 ENCOUNTER — Other Ambulatory Visit: Payer: Self-pay

## 2021-03-28 DIAGNOSIS — R110 Nausea: Secondary | ICD-10-CM | POA: Insufficient documentation

## 2021-03-28 DIAGNOSIS — R109 Unspecified abdominal pain: Secondary | ICD-10-CM

## 2021-03-28 DIAGNOSIS — K5901 Slow transit constipation: Secondary | ICD-10-CM

## 2021-03-28 DIAGNOSIS — I8311 Varicose veins of right lower extremity with inflammation: Secondary | ICD-10-CM | POA: Diagnosis not present

## 2021-03-28 DIAGNOSIS — E871 Hypo-osmolality and hyponatremia: Secondary | ICD-10-CM

## 2021-03-28 DIAGNOSIS — K5900 Constipation, unspecified: Secondary | ICD-10-CM | POA: Insufficient documentation

## 2021-03-28 MED ORDER — ONDANSETRON HCL 4 MG PO TABS: 4.0000 mg | ORAL_TABLET | Freq: Three times a day (TID) | ORAL | 0 refills | Status: DC | PRN

## 2021-03-28 MED ORDER — SENNA-DOCUSATE SODIUM 8.6-50 MG PO TABS: 1.0000 | ORAL_TABLET | Freq: Every day | ORAL | 1 refills | Status: AC

## 2021-03-28 MED ORDER — PANTOPRAZOLE SODIUM 40 MG PO TBEC: 40.0000 mg | DELAYED_RELEASE_TABLET | Freq: Every day | ORAL | 1 refills | Status: DC

## 2021-03-28 NOTE — Assessment & Plan Note (Addendum)
No pain - she has discomfort and nausea ?gastritis relapse D/c ASA, NSAIDs Start Pantoprazole Check CBC, lipase, CMET, UA

## 2021-03-28 NOTE — Assessment & Plan Note (Signed)
Treat gastritis Zofran prn

## 2021-03-28 NOTE — Progress Notes (Signed)
Subjective:  Patient ID: Jessica Rowland, female    DOB: Dec 12, 1931  Age: 87 y.o. MRN: [id number]  CC: Follow-up   HPI Jessica Rowland presents for nausea x 4 months off and on, no vomiting.  C/o loss of taste x 3-4 months No known h/o COVID   Outpatient Medications Prior to Visit  Medication Sig Dispense Refill   aspirin EC 81 MG tablet Take 81 mg by mouth daily.      b complex vitamins tablet Take 1 tablet by mouth daily. 100 tablet 3   Cholecalciferol (VITAMIN D3) 2000 units capsule Take 1 capsule (2,000 Units total) by mouth daily. 100 capsule 3   diphenhydrAMINE (BENADRYL ALLERGY) 25 MG tablet Take 0.5-1 tablets (12.5-25 mg total) by mouth at bedtime as needed for sleep. 60 tablet 3   Ginkgo Biloba 120 MG CAPS Take 120 mg by mouth 3 (three) times daily.     Omega-3 Fatty Acids (FISH OIL) 1000 MG CAPS Take 1,000 mg by mouth 2 (two) times daily.     acyclovir (ZOVIRAX) 400 MG tablet Take 2 tablets (800 mg total) by mouth 4 (four) times daily. For shingles (Patient not taking: Reported on 03/28/2021) 56 tablet 1   ibuprofen (ADVIL) 400 MG tablet Take 1 tablet (400 mg total) by mouth every 8 (eight) hours as needed (ear pain). (Patient not taking: Reported on 03/28/2021) 60 tablet 1   No facility-administered medications prior to visit.    ROS: Review of Systems  Constitutional:  Positive for unexpected weight change. Negative for activity change, appetite change, chills and fatigue.  HENT:  Negative for congestion, mouth sores and sinus pressure.   Eyes:  Negative for visual disturbance.  Respiratory:  Negative for cough and chest tightness.   Gastrointestinal:  Positive for nausea. Negative for abdominal pain and vomiting.  Genitourinary:  Negative for difficulty urinating, frequency and vaginal pain.  Musculoskeletal:  Negative for back pain and gait problem.  Skin:  Negative for pallor and rash.  Neurological:  Negative for dizziness, tremors, weakness, numbness and  headaches.  Psychiatric/Behavioral:  Negative for confusion and sleep disturbance.    Objective:  BP (!) 148/68 (BP Location: Left Arm)   Pulse 82   Temp 98.2 F (36.8 C) (Oral)   Ht 5' (1.524 m)   Wt 97 lb 6.4 oz (44.2 kg)   SpO2 98%   BMI 19.02 kg/m   BP Readings from Last 3 Encounters:  03/28/21 (!) 148/68  06/28/20 (!) 152/59  06/21/20 (!) 159/69    Wt Readings from Last 3 Encounters:  03/28/21 97 lb 6.4 oz (44.2 kg)  06/21/20 100 lb (45.4 kg)  06/07/20 100 lb (45.4 kg)    Physical Exam Constitutional:      General: She is not in acute distress.    Appearance: She is well-developed.  HENT:     Head: Normocephalic.     Right Ear: External ear normal.     Left Ear: External ear normal.     Nose: Nose normal.  Eyes:     General:        Right eye: No discharge.        Left eye: No discharge.     Conjunctiva/sclera: Conjunctivae normal.     Pupils: Pupils are equal, round, and reactive to light.  Neck:     Thyroid: No thyromegaly.     Vascular: No JVD.     Trachea: No tracheal deviation.  Cardiovascular:     Rate and Rhythm: Normal  rate and regular rhythm.     Heart sounds: Normal heart sounds.  Pulmonary:     Effort: No respiratory distress.     Breath sounds: No stridor. No wheezing.  Abdominal:     General: Bowel sounds are normal. There is no distension.     Palpations: Abdomen is soft. There is no mass.     Tenderness: There is no abdominal tenderness. There is no guarding or rebound.  Musculoskeletal:        General: No tenderness.     Cervical back: Normal range of motion and neck supple. No rigidity.  Lymphadenopathy:     Cervical: No cervical adenopathy.  Skin:    Findings: No erythema or rash.  Neurological:     Cranial Nerves: No cranial nerve deficit.     Motor: No abnormal muscle tone.     Coordination: Coordination normal.     Deep Tendon Reflexes: Reflexes normal.  Psychiatric:        Behavior: Behavior normal.        Thought Content:  Thought content normal.        Judgment: Judgment normal.    Lab Results  Component Value Date   WBC 6.4 03/16/2021   HGB 13.1 03/16/2021   HCT 38.2 03/16/2021   PLT 234.0 03/16/2021   GLUCOSE 101 (H) 03/16/2021   CHOL 259 (H) 03/16/2021   TRIG 203.0 (H) 03/16/2021   HDL 72.50 03/16/2021   LDLDIRECT 158.0 03/16/2021   ALT 16 03/16/2021   AST 16 03/16/2021   NA 127 (L) 03/16/2021   K 4.6 03/16/2021   CL 95 (L) 03/16/2021   CREATININE 0.54 03/16/2021   BUN 16 03/16/2021   CO2 27 03/16/2021   TSH 1.03 03/16/2021    VAS [place] LOWER EXTREMITY VENOUS POST ABLATION  Result Date: 06/28/2020  Lower Venous DVT Study Indications: Pain, Swelling, and Edema.  Risk Factors: Surgery Follow up status post right great saphenousv vein ablation on 06/21/2020. Performing Technologist: Delorise Shiner RVT  Examination Guidelines: A complete evaluation includes B-mode imaging, spectral Doppler, color Doppler, and power Doppler as needed of all accessible portions of each vessel. Bilateral testing is considered an integral part of a complete examination. Limited examinations for reoccurring indications may be performed as noted. The reflux portion of the exam is performed with the patient in reverse Trendelenburg.  +---------+---------------+---------+-----------+----------+--------------+ RIGHT    CompressibilityPhasicitySpontaneityPropertiesThrombus Aging +---------+---------------+---------+-----------+----------+--------------+ CFV      Full           Yes      Yes                                 +---------+---------------+---------+-----------+----------+--------------+ SFJ      Full           Yes      Yes                                 +---------+---------------+---------+-----------+----------+--------------+ FV Prox  Full                                                        +---------+---------------+---------+-----------+----------+--------------+ FV Mid   Full                                                         +---------+---------------+---------+-----------+----------+--------------+  FV DistalFull                                                        +---------+---------------+---------+-----------+----------+--------------+ GSV      None                    No         dilated                  +---------+---------------+---------+-----------+----------+--------------+ Proximal edge of great saphenous vein thrombus is 0.59 cm from the sapheno-femoral junction. No thrombus seen in right common femoral vein. Great saphenous vein is thrombosed and non compressible to the distal thigh.  +----+---------------+---------+-----------+----------+--------------+ LEFTCompressibilityPhasicitySpontaneityPropertiesThrombus Aging +----+---------------+---------+-----------+----------+--------------+ CFV Full           Yes      Yes                                 +----+---------------+---------+-----------+----------+--------------+     Summary: RIGHT: - Findings suggest successful ablation of right great saphenous vein.   *See table(s) above for measurements and observations. Electronically signed by Ruta Hinds MD on 06/28/2020 at 4:19:17 PM.    Final     Assessment & Plan:     Walker Kehr, MD

## 2021-03-28 NOTE — Patient Instructions (Addendum)
Get elastic compression sleeves for legs for kids Stop Aspirin Fluid restriction <1.5 l/day   ????????????? Hyponatremia ????????????? -- ??? ???????, ??? ??????? ?????????? ???? (??????) ? ????? ??????????? ???? ?????. ??? ???????? ???????????? ?????? ? ????? ???? ?????? ???????? ????????????? ?????????????? ???? ? ????????. ???? ???????????? ????? ????, ?? ? ?????????? ??????? ?????? ?? ???????? ????. ?????? ???????? ????????? ????? ????????? ????? ????: ??????? ???????????? ??????????? ?????????, ????? ???: ????????? ?? ??????? ??????, ????? ??? ??????. ????????? ?? ??????? ?????????? ??????. ????????? ?? ??????? ?????????????. ?????????????? ?????????, ????? ??? ??????? ???????? ??? ??????? ???????????? ???????? ?????????????????? ??????? (SIADH). ??????? ????? ??? ??????. ????????? ????????? ??? ??????????? ?????????. ?????????????. ??????????? ??????????? ????. ?????? ??????? ? ?????? ??????????? ??????. ???????? ????? ?? ????. ?????????? ?????????????. ??? ???????? ????? ??????????? ????????? ????? ????????? ? ??? ??????????, ???? ? ???: ?????????? (???????????) ??????????? ?????. ????????? ???????????????. ??????????? ?????????, ??????? ???????? ??????? ??? ??????? ??????. ??????? ? ???????????? ? ??????? ?????????? ?????????, ????????, ??????????? ???. ????? ???????????? ????????????? ??????????, ??????? ?????? ?? ?????? ?????? ? ???????? ? ?????. ?????????? ?? ???? ????? ?????????? ????????: ?????????. ???? (???????????? ????????????????????? ?????????). ????????? ????????? ?????????????? ????????. ????????? ???????????????. ????????? ????????? ??? ???????????? ?????????? ?????????. ?????? ???????? ??? ????????? ?????????? ????? ????????? ????????: ???????? ????. ??????? ? ?????. ??????? ????????? (?????? ?????????). ???????? ???????? ? ??????. ?????? ????????. ???????? ??? ??????????????. ???????? ?????????? ????? ????????? ????????: ???????????  ????????. ???????????. ????????? ??????? ????????? ??????????. ?????? ???????? (???????). ?????????? ????????. ????. ??? ??? ?????????????? ??? ????????? ??????????????? ? ??????: ???????????? ????????????. ?????? ???????????? ????????. ????????, ? ??? ?????: ??????? ?????. ??????? ????. ??? ??? ?????? ??????? ????? ????????? ??????? ?? ???????. ??????? ????? ????????: ???????????? ???????? ????????? ????? ??????????, ??????????? ? ???? ?? ????? ???. ????????? ??? ???????????? ?????????? ???????. ???? ???????? ????????? ????????? ???????? ????? ???????????? ????????????? ??????????, ?????????? ????????? ? ??????????? ????????? ???????????????? ???????. ??????????? ??????????? ???? ??? ???????? ??? ???????????? ?????????? ???????. ?????????? ? ???????? ?????????? ??? ??????????? ?????????? ?? ?????????? ?? ?? ?????????. ? ???????? ???????? ???????? ????? ????????????:  ?????????? ?????????????? ? ??????????? ????????????? ????????? ?????? ??? ??????? ????? ??????? ??????. ?????? ????????????? ????????? ????? ???????? ??? ?????????. ?????????? ? ????? ??????? ?????? ? ??????????, ??????? ?? ? ????????? ????? ??????????. ?????? ??????????????? ??????????????? ?????, ??? ??????? ????? ??????? ??????. ??????????? ?????????? ??????????? ?????? ???????? ?????, ?????????? ??????????? ??????????? ?????????. ?? ????? ????????. ????????? ?? ??? ?????? ???????????? ?????????? ? ???????????? ? ?????????? ?????? ???????? ?????. ??? ?????. ?????????? ? ???????? ?????, ????: ? ??? ???????? ???????, ????? ?????????? ????????????, ???????? ????, ??????????? ???????? ??? ????????. ???? ???????? ???????, ? ????? ????????. ? ??? ???????? ????????? ? ??????????? ??????????????? ?????. ?????????? ?????????? ?? ???????, ????: ? ??? ?????? ?????????? ???????. ?? ????? ? ???????. ? ??? ?? ???????? ????? ??? ??????. ?????? ????????????? -- ??? ???????, ??? ??????? ?????????? ???? (??????) ? ????? ???????????  ???? ?????. ??? ???????? ???????????? ?????? ? ????? ???? ?????? ???????? ????????????? ?????????????? ???? ? ????????. ???? ?????????? ?? ????? ????, ?? ? ?????????? ??????? ?????? ?? ???????? ????. ??????? ????? ????????? ??????? ?? ???????. ??? ????? ???????? ???????? ?/? ????????, ????? ????????????? ?????????? ? ??????????? ??????????? ????????. ??? ?????????? ?? ????? ???????? ??????, ??????????????? ????? ??????.??????????? ???????? ????? ???????????? ??? ??????? ? ????? ??????? ??????. Document Revised: 07/03/2018 Document Reviewed: 07/03/2018 Elsevier Patient Education  2022 [employer].

## 2021-03-28 NOTE — Assessment & Plan Note (Signed)
?  etiology Fluid restriction Re-check in 6 wks

## 2021-03-28 NOTE — Assessment & Plan Note (Signed)
Worse Start Senokot S daily

## 2021-04-02 ENCOUNTER — Other Ambulatory Visit: Payer: Self-pay

## 2021-04-02 ENCOUNTER — Encounter (INDEPENDENT_AMBULATORY_CARE_PROVIDER_SITE_OTHER): Payer: Medicare Other | Admitting: Ophthalmology

## 2021-04-02 DIAGNOSIS — H43813 Vitreous degeneration, bilateral: Secondary | ICD-10-CM

## 2021-04-02 DIAGNOSIS — H59033 Cystoid macular edema following cataract surgery, bilateral: Secondary | ICD-10-CM

## 2021-04-02 DIAGNOSIS — H353132 Nonexudative age-related macular degeneration, bilateral, intermediate dry stage: Secondary | ICD-10-CM | POA: Diagnosis not present

## 2021-05-17 ENCOUNTER — Encounter: Payer: Self-pay | Admitting: Internal Medicine

## 2021-05-17 ENCOUNTER — Ambulatory Visit (INDEPENDENT_AMBULATORY_CARE_PROVIDER_SITE_OTHER): Payer: Medicare Other | Admitting: Internal Medicine

## 2021-05-17 ENCOUNTER — Other Ambulatory Visit: Payer: Self-pay

## 2021-05-17 VITALS — BP 142/70 | HR 80 | Temp 98.1°F | Ht 60.0 in | Wt 97.2 lb

## 2021-05-17 DIAGNOSIS — Z23 Encounter for immunization: Secondary | ICD-10-CM | POA: Diagnosis not present

## 2021-05-17 DIAGNOSIS — K811 Chronic cholecystitis: Secondary | ICD-10-CM | POA: Diagnosis not present

## 2021-05-17 DIAGNOSIS — R269 Unspecified abnormalities of gait and mobility: Secondary | ICD-10-CM | POA: Diagnosis not present

## 2021-05-17 DIAGNOSIS — R109 Unspecified abdominal pain: Secondary | ICD-10-CM

## 2021-05-17 DIAGNOSIS — R413 Other amnesia: Secondary | ICD-10-CM

## 2021-05-17 DIAGNOSIS — R110 Nausea: Secondary | ICD-10-CM

## 2021-05-17 DIAGNOSIS — E871 Hypo-osmolality and hyponatremia: Secondary | ICD-10-CM | POA: Diagnosis not present

## 2021-05-17 LAB — CBC WITH DIFFERENTIAL/PLATELET
Basophils Absolute: 0 10*3/uL (ref 0.0–0.1)
Basophils Relative: 0.6 % (ref 0.0–3.0)
Eosinophils Absolute: 0 10*3/uL (ref 0.0–0.7)
Eosinophils Relative: 0.6 % (ref 0.0–5.0)
HCT: 40.5 % (ref 36.0–46.0)
Hemoglobin: 13.3 g/dL (ref 12.0–15.0)
Lymphocytes Relative: 19.7 % (ref 12.0–46.0)
Lymphs Abs: 1.4 10*3/uL (ref 0.7–4.0)
MCHC: 32.9 g/dL (ref 30.0–36.0)
MCV: 91.3 fl (ref 78.0–100.0)
Monocytes Absolute: 0.5 10*3/uL (ref 0.1–1.0)
Monocytes Relative: 7.6 % (ref 3.0–12.0)
Neutro Abs: 4.9 10*3/uL (ref 1.4–7.7)
Neutrophils Relative %: 71.5 % (ref 43.0–77.0)
Platelets: 234 10*3/uL (ref 150.0–400.0)
RBC: 4.43 Mil/uL (ref 3.87–5.11)
RDW: 13.5 % (ref 11.5–15.5)
WBC: 6.9 10*3/uL (ref 4.0–10.5)

## 2021-05-17 LAB — URINALYSIS, ROUTINE W REFLEX MICROSCOPIC
Bilirubin Urine: NEGATIVE
Hgb urine dipstick: NEGATIVE
Ketones, ur: NEGATIVE
Nitrite: NEGATIVE
RBC / HPF: NONE SEEN (ref 0–?)
Specific Gravity, Urine: 1.015 (ref 1.000–1.030)
Total Protein, Urine: NEGATIVE
Urine Glucose: NEGATIVE
Urobilinogen, UA: 0.2 (ref 0.0–1.0)
pH: 6 (ref 5.0–8.0)

## 2021-05-17 LAB — COMPREHENSIVE METABOLIC PANEL
ALT: 17 U/L (ref 0–35)
AST: 19 U/L (ref 0–37)
Albumin: 4.3 g/dL (ref 3.5–5.2)
Alkaline Phosphatase: 55 U/L (ref 39–117)
BUN: 12 mg/dL (ref 6–23)
CO2: 24 mEq/L (ref 19–32)
Calcium: 9.1 mg/dL (ref 8.4–10.5)
Chloride: 98 mEq/L (ref 96–112)
Creatinine, Ser: 0.53 mg/dL (ref 0.40–1.20)
GFR: 83.52 mL/min (ref >60.00)
Glucose, Bld: 97 mg/dL (ref 70–99)
Potassium: 3.9 mEq/L (ref 3.5–5.1)
Sodium: 133 mEq/L — ABNORMAL LOW (ref 135–145)
Total Bilirubin: 0.6 mg/dL (ref 0.2–1.2)
Total Protein: 6.7 g/dL (ref 6.0–8.3)

## 2021-05-17 LAB — LIPASE: Lipase: 71 U/L — ABNORMAL HIGH (ref 11.0–59.0)

## 2021-05-17 MED ORDER — PANCRELIPASE (LIP-PROT-AMYL) 36000-114000 UNITS PO CPEP: 36000.0000 [IU] | ORAL_CAPSULE | Freq: Three times a day (TID) | ORAL | 3 refills | Status: DC

## 2021-05-17 NOTE — Assessment & Plan Note (Signed)
Correcting On water restriction.

## 2021-05-17 NOTE — Assessment & Plan Note (Signed)
Try Coralyn Helling' main mushroom

## 2021-05-17 NOTE — Patient Instructions (Signed)
You can try [employer] [place] Mushroom capsules for memory problems, decreased focus, mental fog, neuropathy ([email or web address])   

## 2021-05-17 NOTE — Assessment & Plan Note (Signed)
Cont w/walking

## 2021-05-17 NOTE — Progress Notes (Signed)
Subjective:  Patient ID: Jessica Rowland, female    DOB: [birth year]  Age: 87 y.o. MRN: [id number]  CC: Follow-up   HPI Jessica Rowland presents for nausea - better, low sodium, constipation f/u On water restriction. F/u on GERD Abd pain occasionally w/meals - better  Outpatient Medications Prior to Visit  Medication Sig Dispense Refill   Cholecalciferol (VITAMIN D3) 2000 units capsule Take 1 capsule (2,000 Units total) by mouth daily. 100 capsule 3   diphenhydrAMINE (BENADRYL ALLERGY) 25 MG tablet Take 0.5-1 tablets (12.5-25 mg total) by mouth at bedtime as needed for sleep. 60 tablet 3   Ginkgo Biloba 120 MG CAPS Take 120 mg by mouth 3 (three) times daily.     ondansetron (ZOFRAN) 4 MG tablet Take 1 tablet (4 mg total) by mouth every 8 (eight) hours as needed for nausea or vomiting. 20 tablet 0   pantoprazole (PROTONIX) 40 MG tablet Take 1 tablet (40 mg total) by mouth daily. 90 tablet 1   sennosides-docusate sodium (SENOKOT-S) 8.6-50 MG tablet Take 1 tablet by mouth daily. 90 tablet 1   b complex vitamins tablet Take 1 tablet by mouth daily. 100 tablet 3   Omega-3 Fatty Acids (FISH OIL) 1000 MG CAPS Take 1,000 mg by mouth 2 (two) times daily.     No facility-administered medications prior to visit.    ROS: Review of Systems  Constitutional:  Negative for activity change, appetite change, chills, fatigue and unexpected weight change.  HENT:  Negative for congestion, mouth sores and sinus pressure.   Eyes:  Negative for visual disturbance.  Respiratory:  Negative for cough and chest tightness.   Gastrointestinal:  Negative for abdominal pain, nausea and vomiting.  Genitourinary:  Negative for difficulty urinating, frequency and vaginal pain.  Musculoskeletal:  Negative for back pain and gait problem.  Skin:  Negative for pallor and rash.  Neurological:  Negative for dizziness, tremors, weakness, numbness and headaches.  Psychiatric/Behavioral:  Negative for confusion, sleep  disturbance and suicidal ideas.    Objective:  BP (!) 142/70 (BP Location: Left Arm)   Pulse 80   Temp 98.1 F (36.7 C) (Oral)   Ht 5' (1.524 m)   Wt 97 lb 3.2 oz (44.1 kg)   SpO2 99%   BMI 18.98 kg/m   BP Readings from Last 3 Encounters:  05/17/21 (!) 142/70  03/28/21 (!) 148/68  06/28/20 (!) 152/59    Wt Readings from Last 3 Encounters:  05/17/21 97 lb 3.2 oz (44.1 kg)  03/28/21 97 lb 6.4 oz (44.2 kg)  06/21/20 100 lb (45.4 kg)    Physical Exam Constitutional:      General: She is not in acute distress.    Appearance: She is well-developed.  HENT:     Head: Normocephalic.     Right Ear: External ear normal.     Left Ear: External ear normal.     Nose: Nose normal.  Eyes:     General:        Right eye: No discharge.        Left eye: No discharge.     Conjunctiva/sclera: Conjunctivae normal.     Pupils: Pupils are equal, round, and reactive to light.  Neck:     Thyroid: No thyromegaly.     Vascular: No JVD.     Trachea: No tracheal deviation.  Cardiovascular:     Rate and Rhythm: Normal rate and regular rhythm.     Heart sounds: Normal heart sounds.  Pulmonary:  Effort: No respiratory distress.     Breath sounds: No stridor. No wheezing.  Abdominal:     General: Bowel sounds are normal. There is no distension.     Palpations: Abdomen is soft. There is no mass.     Tenderness: There is no abdominal tenderness. There is no guarding or rebound.  Musculoskeletal:        General: No tenderness.     Cervical back: Normal range of motion and neck supple.  Lymphadenopathy:     Cervical: No cervical adenopathy.  Skin:    Findings: No erythema or rash.  Neurological:     Cranial Nerves: No cranial nerve deficit.     Motor: No abnormal muscle tone.     Coordination: Coordination normal.     Deep Tendon Reflexes: Reflexes normal.  Psychiatric:        Behavior: Behavior normal.        Thought Content: Thought content normal.        Judgment: Judgment normal.    Thin  Lab Results  Component Value Date   WBC 6.4 03/16/2021   HGB 13.1 03/16/2021   HCT 38.2 03/16/2021   PLT 234.0 03/16/2021   GLUCOSE 101 (H) 03/16/2021   CHOL 259 (H) 03/16/2021   TRIG 203.0 (H) 03/16/2021   HDL 72.50 03/16/2021   LDLDIRECT 158.0 03/16/2021   ALT 16 03/16/2021   AST 16 03/16/2021   NA 127 (L) 03/16/2021   K 4.6 03/16/2021   CL 95 (L) 03/16/2021   CREATININE 0.54 03/16/2021   BUN 16 03/16/2021   CO2 27 03/16/2021   TSH 1.03 03/16/2021    VAS [place] LOWER EXTREMITY VENOUS POST ABLATION  Result Date: 06/28/2020  Lower Venous DVT Study Indications: Pain, Swelling, and Edema.  Risk Factors: Surgery Follow up status post right great saphenousv vein ablation on 06/21/2020. Performing Technologist: Delorise Shiner RVT  Examination Guidelines: A complete evaluation includes B-mode imaging, spectral Doppler, color Doppler, and power Doppler as needed of all accessible portions of each vessel. Bilateral testing is considered an integral part of a complete examination. Limited examinations for reoccurring indications may be performed as noted. The reflux portion of the exam is performed with the patient in reverse Trendelenburg.  +---------+---------------+---------+-----------+----------+--------------+ RIGHT    CompressibilityPhasicitySpontaneityPropertiesThrombus Aging +---------+---------------+---------+-----------+----------+--------------+ CFV      Full           Yes      Yes                                 +---------+---------------+---------+-----------+----------+--------------+ SFJ      Full           Yes      Yes                                 +---------+---------------+---------+-----------+----------+--------------+ FV Prox  Full                                                        +---------+---------------+---------+-----------+----------+--------------+ FV Mid   Full                                                         +---------+---------------+---------+-----------+----------+--------------+  FV DistalFull                                                        +---------+---------------+---------+-----------+----------+--------------+ GSV      None                    No         dilated                  +---------+---------------+---------+-----------+----------+--------------+ Proximal edge of great saphenous vein thrombus is 0.59 cm from the sapheno-femoral junction. No thrombus seen in right common femoral vein. Great saphenous vein is thrombosed and non compressible to the distal thigh.  +----+---------------+---------+-----------+----------+--------------+ LEFTCompressibilityPhasicitySpontaneityPropertiesThrombus Aging +----+---------------+---------+-----------+----------+--------------+ CFV Full           Yes      Yes                                 +----+---------------+---------+-----------+----------+--------------+     Summary: RIGHT: - Findings suggest successful ablation of right great saphenous vein.   *See table(s) above for measurements and observations. Electronically signed by Ruta Hinds MD on 06/28/2020 at 4:19:17 PM.    Final     Assessment & Plan:   Problem List Items Addressed This Visit     Abdominal pain    Much better Occasional pains Creon trial w/food Work up offered - pt declined      Cholecystitis, chronic    Occasional pains Creon trial w/food Work up offered - pt declined      Gait disorder    Cont w/walking      Hyponatremia    Correcting On water restriction.      Memory problem    Try Coralyn Helling' main mushroom       Other Visit Diagnoses     Needs flu shot    -  Primary   Relevant Orders   Flu Vaccine QUAD High Dose(Fluad) (Completed)         Follow-up: Return in about 3 months (around 08/17/2021) for a follow-up visit.  Walker Kehr, MD

## 2021-05-17 NOTE — Addendum Note (Signed)
Addended by: Jacobo Forest on: 05/17/2021 10:05 AM   Modules accepted: Orders

## 2021-05-17 NOTE — Assessment & Plan Note (Addendum)
Much better Occasional pains Creon trial w/food Work up offered - pt declined

## 2021-05-17 NOTE — Assessment & Plan Note (Signed)
Occasional pains Creon trial w/food Work up offered - pt declined

## 2021-05-21 ENCOUNTER — Other Ambulatory Visit: Payer: Self-pay | Admitting: Internal Medicine

## 2021-05-21 DIAGNOSIS — K811 Chronic cholecystitis: Secondary | ICD-10-CM

## 2021-05-21 DIAGNOSIS — R1011 Right upper quadrant pain: Secondary | ICD-10-CM

## 2021-05-21 DIAGNOSIS — R634 Abnormal weight loss: Secondary | ICD-10-CM

## 2021-05-25 ENCOUNTER — Other Ambulatory Visit: Payer: Self-pay | Admitting: Internal Medicine

## 2021-05-25 DIAGNOSIS — R269 Unspecified abnormalities of gait and mobility: Secondary | ICD-10-CM

## 2021-05-25 DIAGNOSIS — R110 Nausea: Secondary | ICD-10-CM

## 2021-05-25 DIAGNOSIS — R1011 Right upper quadrant pain: Secondary | ICD-10-CM

## 2021-05-25 DIAGNOSIS — R634 Abnormal weight loss: Secondary | ICD-10-CM

## 2021-05-26 DIAGNOSIS — Z23 Encounter for immunization: Secondary | ICD-10-CM | POA: Diagnosis not present

## 2021-06-04 ENCOUNTER — Ambulatory Visit: Payer: Medicare Other | Admitting: Internal Medicine

## 2021-06-09 IMAGING — CT CT HEAD WITHOUT CONTRAST
3 series · 15 of 46 positions shown, 18 images · non-contrast
Comparison: None

CLINICAL DATA: Ataxia increased since falling on 10/18/2018 and
fracturing wrist, cerebellar ataxia new, question stroke, history
hyperlipidemia

EXAM:
CT HEAD WITHOUT CONTRAST
TECHNIQUE: Contiguous axial images were obtained from the base of the skull
through the vertex without intravenous contrast. Sagittal and
coronal MPR images reconstructed from axial data set.

[Series 2: head 5.0 h37s · axial · 0.39mm/px · z∈[+1376,+1496]mm · 9 of 29 slices shown, 12 images]
[im 3/29  brain]
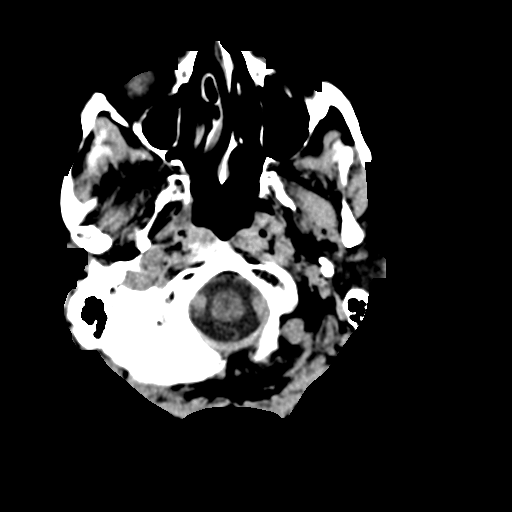
[im 3/29  bone]
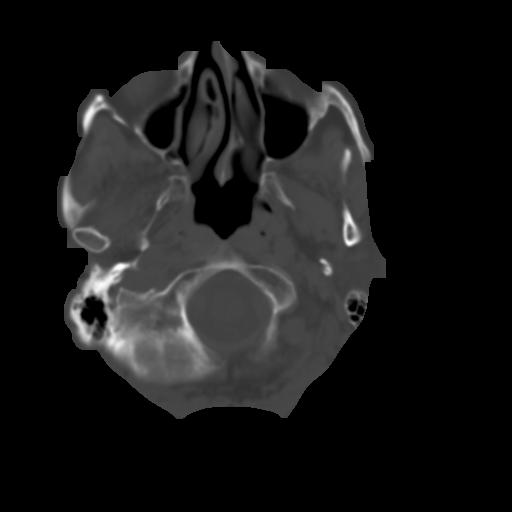
[im 6/29  brain]
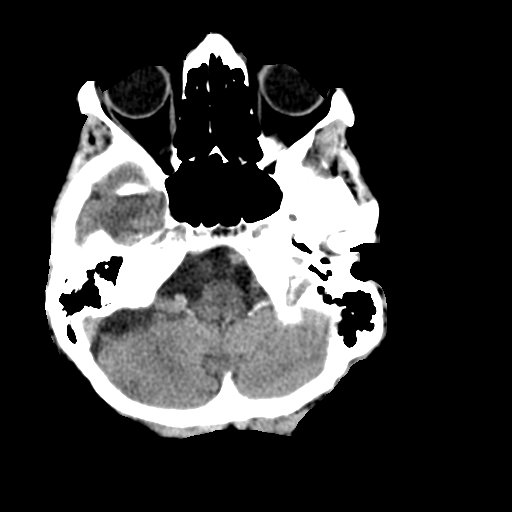
[im 9/29  brain]
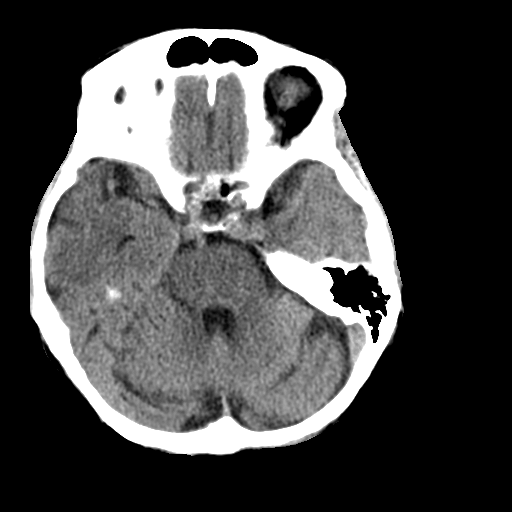
[im 12/29  brain]
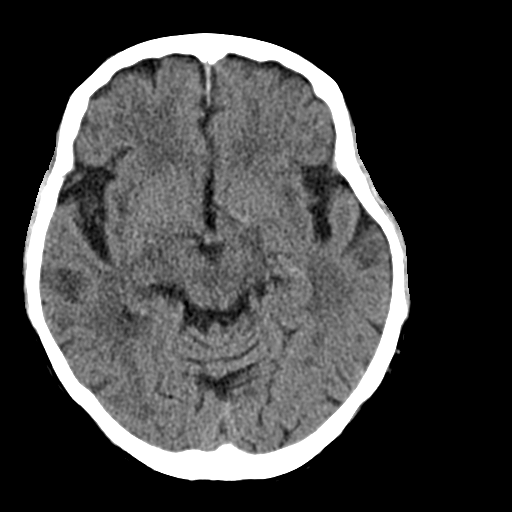
[im 15/29  brain]
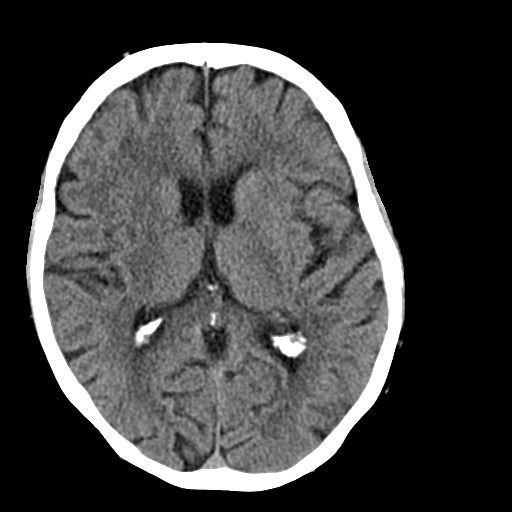
[im 15/29  bone]
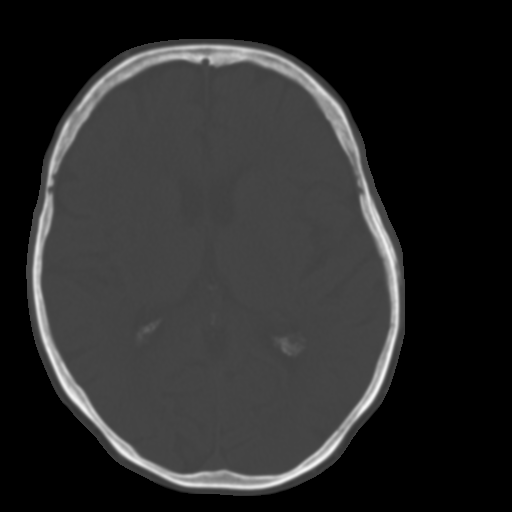
[im 18/29  brain]
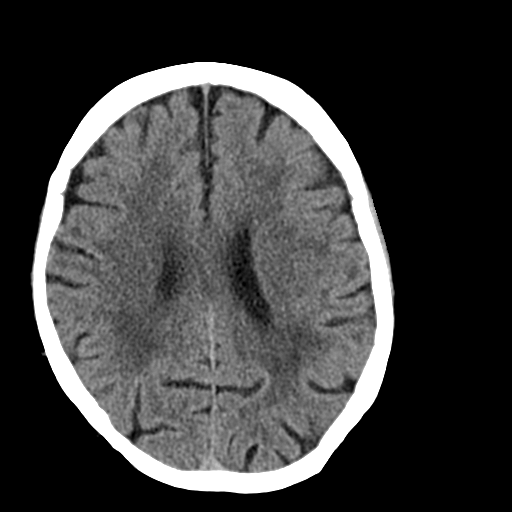
[im 21/29  brain]
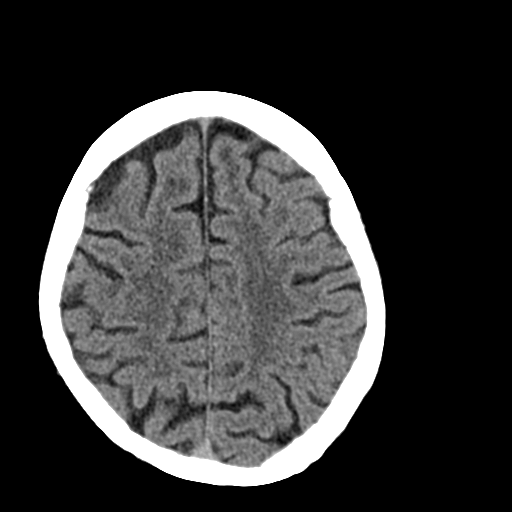
[im 24/29  brain]
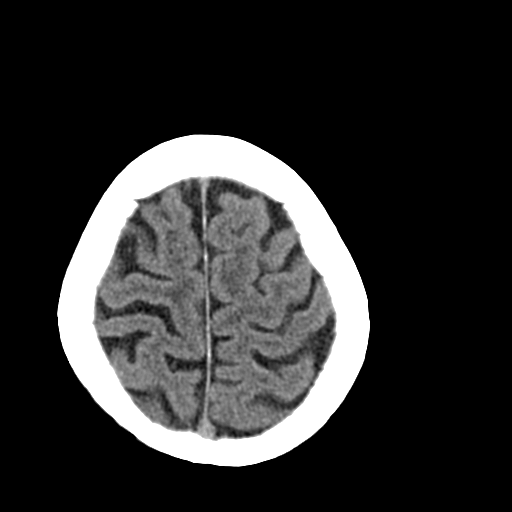
[im 27/29  brain]
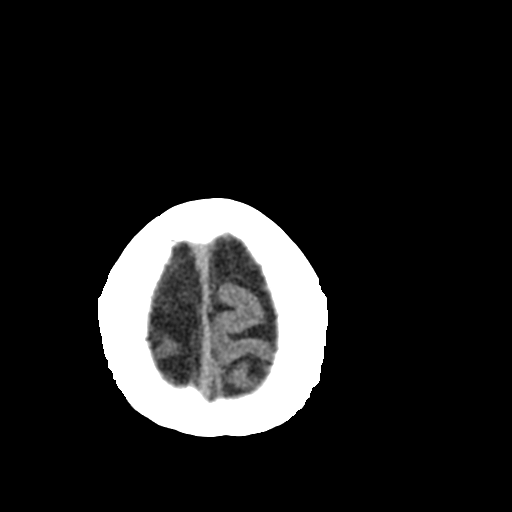
[im 27/29  bone]
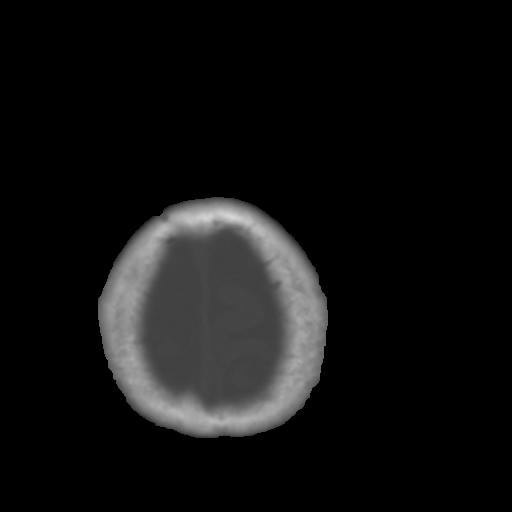

[Series 4: head 3.0 mpr cor · coronal · 0.29mm/px · 3 of 63 slices shown]
[im 21/63  brain]
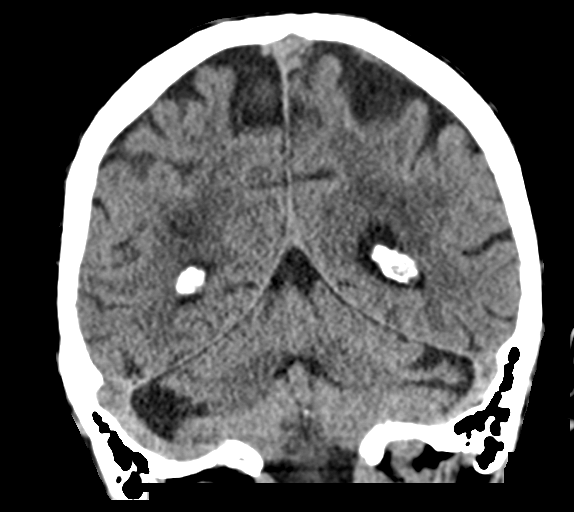
[im 28/63  brain]
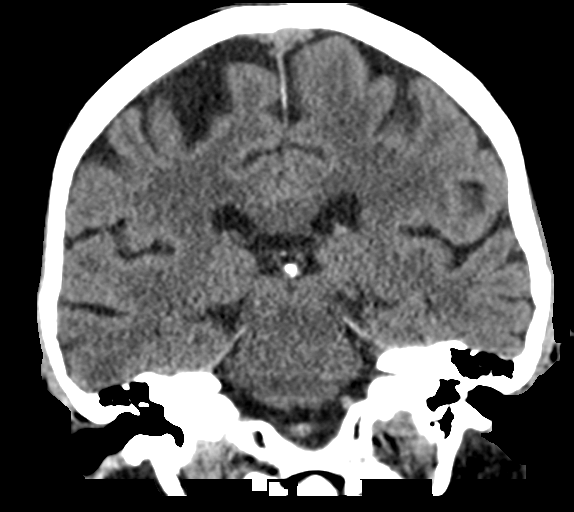
[im 35/63  brain]
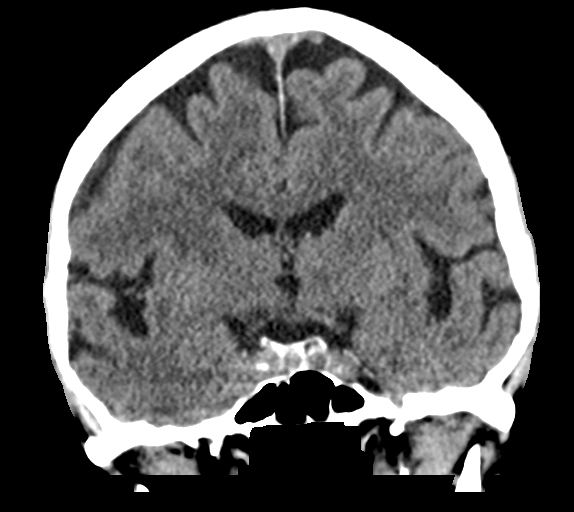

[Series 5: head 3.0 mpr sag · sagittal · 0.28mm/px · 3 of 52 slices shown]
[im 18/52  brain]
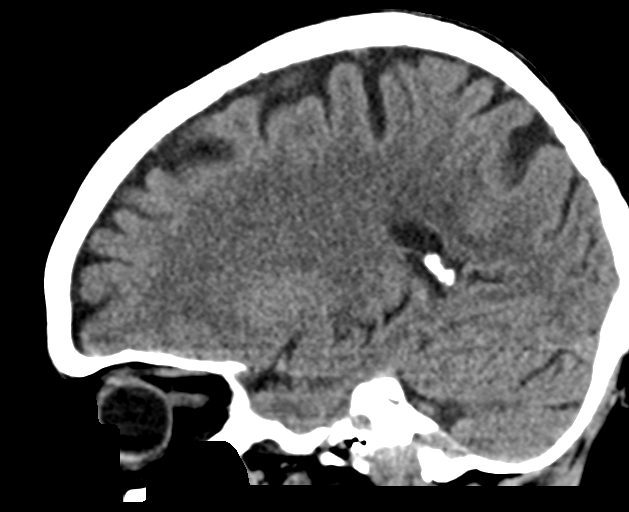
[im 26/52  brain]
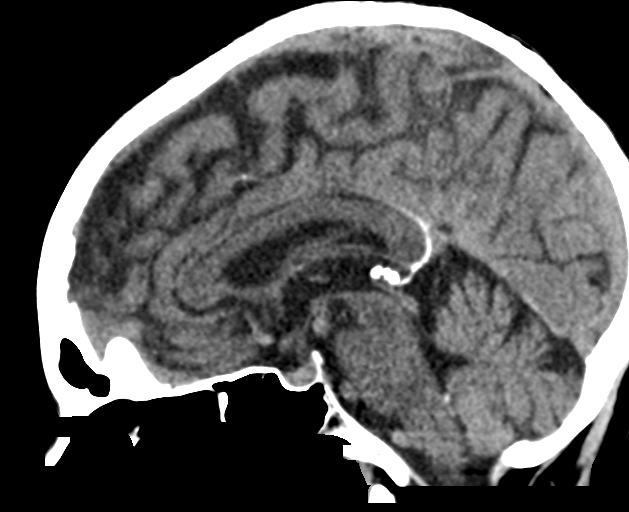
[im 35/52  brain]
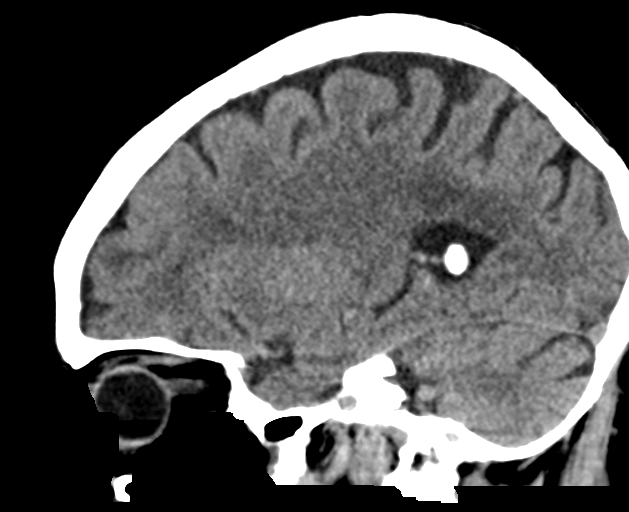

[15 of 46 positions shown; findings below may reference images not displayed]

FINDINGS: Brain: Generalized atrophy. Normal ventricular morphology. No
midline shift or mass effect. Small vessel chronic ischemic changes
of deep cerebral white matter. No intracranial hemorrhage, mass
lesion or evidence of acute infarction. No extra-axial fluid
collections.

Vascular: No hyperdense vessels.

Skull: Osseous demineralization.  Skull intact.

Sinuses/Orbits: Clear

Other: Nodda
IMPRESSION: Atrophy with small vessel chronic ischemic changes of deep cerebral
white matter.

No acute intracranial abnormalities.

## 2021-06-14 ENCOUNTER — Ambulatory Visit
Admission: RE | Admit: 2021-06-14 | Discharge: 2021-06-14 | Disposition: A | Discharge status: Home / Self Care | Payer: Medicare Other | Source: Ambulatory Visit | Attending: Internal Medicine | Admitting: Internal Medicine

## 2021-06-14 ENCOUNTER — Telehealth: Payer: Self-pay | Admitting: *Deleted

## 2021-06-14 ENCOUNTER — Other Ambulatory Visit: Payer: Self-pay

## 2021-06-14 DIAGNOSIS — R634 Abnormal weight loss: Secondary | ICD-10-CM

## 2021-06-14 DIAGNOSIS — R1011 Right upper quadrant pain: Secondary | ICD-10-CM | POA: Diagnosis not present

## 2021-06-14 DIAGNOSIS — M5136 Other intervertebral disc degeneration, lumbar region: Secondary | ICD-10-CM | POA: Diagnosis not present

## 2021-06-14 DIAGNOSIS — N2889 Other specified disorders of kidney and ureter: Secondary | ICD-10-CM | POA: Diagnosis not present

## 2021-06-14 DIAGNOSIS — R110 Nausea: Secondary | ICD-10-CM

## 2021-06-14 DIAGNOSIS — I700 Atherosclerosis of aorta: Secondary | ICD-10-CM | POA: Diagnosis not present

## 2021-06-14 MED ORDER — IOPAMIDOL (ISOVUE-300) INJECTION 61%
80.0000 mL | Freq: Once | INTRAVENOUS | Status: AC | PRN
  Administered 2021-06-14: 80 mL via INTRAVENOUS

## 2021-06-14 NOTE — Telephone Encounter (Signed)
Call Report CT Abdomen  1.7 cm enhancing mass is seen arising anteriorly from right kidney concerning for renal cell carcinoma. Consultation with urology is recommended. The report is in epic.Marland KitchenMarland Kitchen

## 2021-06-16 NOTE — Telephone Encounter (Signed)
Addressed in the test results.

## 2021-06-20 ENCOUNTER — Ambulatory Visit (INDEPENDENT_AMBULATORY_CARE_PROVIDER_SITE_OTHER): Payer: Medicare Other | Admitting: Internal Medicine

## 2021-06-20 ENCOUNTER — Encounter: Payer: Self-pay | Admitting: Internal Medicine

## 2021-06-20 ENCOUNTER — Other Ambulatory Visit: Payer: Self-pay

## 2021-06-20 DIAGNOSIS — R109 Unspecified abdominal pain: Secondary | ICD-10-CM | POA: Diagnosis not present

## 2021-06-20 DIAGNOSIS — R269 Unspecified abnormalities of gait and mobility: Secondary | ICD-10-CM | POA: Diagnosis not present

## 2021-06-20 DIAGNOSIS — E871 Hypo-osmolality and hyponatremia: Secondary | ICD-10-CM | POA: Diagnosis not present

## 2021-06-20 DIAGNOSIS — N2889 Other specified disorders of kidney and ureter: Secondary | ICD-10-CM | POA: Diagnosis not present

## 2021-06-20 NOTE — Patient Instructions (Signed)
[employer] N[employer] Aluminum Rollator walker, 10" Casters

## 2021-06-20 NOTE — Assessment & Plan Note (Signed)
She is on fluid restriction plan

## 2021-06-20 NOTE — Assessment & Plan Note (Addendum)
11/22 CT: 1.7 cm enhancing mass is seen arising anteriorly from right kidney concerning for renal cell carcinoma.  Discussed w/pt Urology referral to see Dr. Alinda Money was made

## 2021-06-20 NOTE — Assessment & Plan Note (Addendum)
Fall prevention discussed.  Try to use a trekking pole or cane for assistance She can get a [place] Aluminum Rollator walker, 10" Casters

## 2021-06-20 NOTE — Progress Notes (Signed)
Subjective:  Patient ID: Jessica Rowland, female    DOB: 1931/09/21  Age: 87 y.o. MRN: [id number]  CC: Follow-up (F/U on CT)   HPI Jessica Rowland presents for a newly diagnosed right renal tumor on the CT scan.  Follow-up on weight loss, abdominal discomfort BP nl at home.  The patient fell once while walking.  Outpatient Medications Prior to Visit  Medication Sig Dispense Refill   Cholecalciferol (VITAMIN D3) 2000 units capsule Take 1 capsule (2,000 Units total) by mouth daily. 100 capsule 3   diphenhydrAMINE (BENADRYL ALLERGY) 25 MG tablet Take 0.5-1 tablets (12.5-25 mg total) by mouth at bedtime as needed for sleep. 60 tablet 3   Ginkgo Biloba 120 MG CAPS Take 120 mg by mouth 3 (three) times daily.     lipase/protease/amylase (CREON) 36000 UNITS CPEP capsule Take 1 capsule (36,000 Units total) by mouth 3 (three) times daily before meals. Take with the first bite of food 180 capsule 3   ondansetron (ZOFRAN) 4 MG tablet Take 1 tablet (4 mg total) by mouth every 8 (eight) hours as needed for nausea or vomiting. 20 tablet 0   pantoprazole (PROTONIX) 40 MG tablet Take 1 tablet (40 mg total) by mouth daily. 90 tablet 1   sennosides-docusate sodium (SENOKOT-S) 8.6-50 MG tablet Take 1 tablet by mouth daily. 90 tablet 1   No facility-administered medications prior to visit.    ROS: Review of Systems  Constitutional:  Positive for fatigue. Negative for activity change, appetite change, chills and unexpected weight change.  HENT:  Negative for congestion, mouth sores and sinus pressure.   Eyes:  Negative for visual disturbance.  Respiratory:  Negative for cough and chest tightness.   Gastrointestinal:  Negative for abdominal pain and nausea.  Genitourinary:  Negative for difficulty urinating, frequency and vaginal pain.  Musculoskeletal:  Positive for gait problem. Negative for back pain.  Skin:  Negative for pallor and rash.  Neurological:  Negative for dizziness, tremors, weakness,  numbness and headaches.  Psychiatric/Behavioral:  Positive for decreased concentration. Negative for confusion, sleep disturbance and suicidal ideas.    Objective:  BP (!) 152/58 (BP Location: Left Arm)   Pulse 71   Temp 98.3 F (36.8 C) (Oral)   SpO2 98%   BP Readings from Last 3 Encounters:  06/20/21 (!) 152/58  05/17/21 (!) 142/70  03/28/21 (!) 148/68    Wt Readings from Last 3 Encounters:  05/17/21 97 lb 3.2 oz (44.1 kg)  03/28/21 97 lb 6.4 oz (44.2 kg)  06/21/20 100 lb (45.4 kg)    Physical Exam Constitutional:      General: She is not in acute distress.    Appearance: Normal appearance. She is well-developed.  HENT:     Head: Normocephalic.     Right Ear: External ear normal.     Left Ear: External ear normal.     Nose: Nose normal.  Eyes:     General:        Right eye: No discharge.        Left eye: No discharge.     Conjunctiva/sclera: Conjunctivae normal.     Pupils: Pupils are equal, round, and reactive to light.  Neck:     Thyroid: No thyromegaly.     Vascular: No JVD.     Trachea: No tracheal deviation.  Cardiovascular:     Rate and Rhythm: Normal rate and regular rhythm.     Heart sounds: Normal heart sounds.  Pulmonary:     Effort: No  respiratory distress.     Breath sounds: No stridor. No wheezing.  Abdominal:     General: Bowel sounds are normal. There is no distension.     Palpations: Abdomen is soft. There is no mass.     Tenderness: There is no abdominal tenderness. There is no guarding or rebound.  Musculoskeletal:        General: No tenderness.     Cervical back: Normal range of motion and neck supple. No rigidity.  Lymphadenopathy:     Cervical: No cervical adenopathy.  Skin:    Findings: No erythema or rash.  Neurological:     Mental Status: She is oriented to person, place, and time.     Cranial Nerves: No cranial nerve deficit.     Motor: No abnormal muscle tone.     Coordination: Coordination normal.     Deep Tendon Reflexes:  Reflexes normal.  Psychiatric:        Behavior: Behavior normal.        Thought Content: Thought content normal.        Judgment: Judgment normal.  Thin  Lab Results  Component Value Date   WBC 6.9 05/17/2021   HGB 13.3 05/17/2021   HCT 40.5 05/17/2021   PLT 234.0 05/17/2021   GLUCOSE 97 05/17/2021   CHOL 259 (H) 03/16/2021   TRIG 203.0 (H) 03/16/2021   HDL 72.50 03/16/2021   LDLDIRECT 158.0 03/16/2021   ALT 17 05/17/2021   AST 19 05/17/2021   NA 133 (L) 05/17/2021   K 3.9 05/17/2021   CL 98 05/17/2021   CREATININE 0.53 05/17/2021   BUN 12 05/17/2021   CO2 24 05/17/2021   TSH 1.03 03/16/2021    CT Abdomen Pelvis W Contrast  Result Date: 06/14/2021 CLINICAL DATA:  Right upper quadrant abdominal pain. EXAM: CT ABDOMEN AND PELVIS WITH CONTRAST TECHNIQUE: Multidetector CT imaging of the abdomen and pelvis was performed using the standard protocol following bolus administration of intravenous contrast. CONTRAST:  38mL ISOVUE-300 IOPAMIDOL (ISOVUE-300) INJECTION 61% COMPARISON:  None. FINDINGS: Lower chest: No acute abnormality. Hepatobiliary: No focal liver abnormality is seen. No gallstones, gallbladder wall thickening, or biliary dilatation. Pancreas: Unremarkable. No pancreatic ductal dilatation or surrounding inflammatory changes. Spleen: Normal in size without focal abnormality. Adrenals/Urinary Tract: Adrenal glands appear normal. 1.7 cm enhancing partially exophytic mass is seen arising anteriorly from lower pole of right kidney concerning for renal cell carcinoma. No hydronephrosis or renal obstruction is noted. Urinary bladder is unremarkable. Stomach/Bowel: Stomach is within normal limits. Appendix appears normal. No evidence of bowel wall thickening, distention, or inflammatory changes. Vascular/Lymphatic: Aortic atherosclerosis. No enlarged abdominal or pelvic lymph nodes. Reproductive: Status post hysterectomy. No adnexal masses. Other: No abdominal wall hernia or abnormality.  No abdominopelvic ascites. Musculoskeletal: Multilevel degenerative disc disease is noted in lower lumbar spine. No acute osseous abnormality is noted. IMPRESSION: 1.7 cm enhancing mass is seen arising anteriorly from right kidney concerning for renal cell carcinoma. Consultation with urology is recommended. These results will be called to the ordering clinician or representative by the Radiologist Assistant, and communication documented in the PACS or zVision Dashboard. Multilevel degenerative disc disease is noted in the lower lumbar spine. Aortic Atherosclerosis (ICD10-I70.0). Electronically Signed   By: Marijo Conception M.D.   On: 06/14/2021 16:27    Assessment & Plan:   Problem List Items Addressed This Visit     Abdominal pain    Overall better.  Abdominal CT report discussed      Gait  disorder    Fall prevention discussed.  Try to use a trekking pole or cane for assistance She can get a [employer] N[employer] Aluminum Rollator walker, 10" Casters      Hyponatremia    She is on fluid restriction plan      Renal mass, right    11/22 CT: 1.7 cm enhancing mass is seen arising anteriorly from right kidney concerning for renal cell carcinoma.  Discussed w/pt Urology referral to see Dr. Alinda Money was made       Relevant Orders   Ambulatory referral to Urology      No orders of the defined types were placed in this encounter.     Follow-up: Return in about 2 months (around 08/20/2021) for a follow-up visit.  Walker Kehr, MD

## 2021-06-20 NOTE — Assessment & Plan Note (Signed)
Overall better.  Abdominal CT report discussed

## 2021-06-25 DIAGNOSIS — H40023 Open angle with borderline findings, high risk, bilateral: Secondary | ICD-10-CM | POA: Diagnosis not present

## 2021-07-03 ENCOUNTER — Other Ambulatory Visit: Payer: Self-pay

## 2021-07-03 ENCOUNTER — Encounter (INDEPENDENT_AMBULATORY_CARE_PROVIDER_SITE_OTHER): Payer: Medicare Other | Admitting: Ophthalmology

## 2021-07-03 DIAGNOSIS — H353132 Nonexudative age-related macular degeneration, bilateral, intermediate dry stage: Secondary | ICD-10-CM | POA: Diagnosis not present

## 2021-07-03 DIAGNOSIS — H59033 Cystoid macular edema following cataract surgery, bilateral: Secondary | ICD-10-CM | POA: Diagnosis not present

## 2021-07-03 DIAGNOSIS — H43813 Vitreous degeneration, bilateral: Secondary | ICD-10-CM

## 2021-07-25 DIAGNOSIS — D49511 Neoplasm of unspecified behavior of right kidney: Secondary | ICD-10-CM | POA: Diagnosis not present

## 2021-07-30 ENCOUNTER — Ambulatory Visit: Payer: Medicare Other | Admitting: Internal Medicine

## 2021-07-30 ENCOUNTER — Telehealth: Payer: Medicare Other | Admitting: Internal Medicine

## 2021-07-30 ENCOUNTER — Telehealth: Payer: Self-pay

## 2021-07-30 NOTE — Telephone Encounter (Signed)
Patient had a virtual appt yesterday and was prescribed the COVID medication. The pharmacy is out and the daughter is call to see where else the medication can be picked up for. Patient is on day 3 of have COVid.   Daughter would like a call back to [phone].

## 2021-07-30 NOTE — Telephone Encounter (Signed)
Pt daughter states mom had virtual appt  and the provider sent paxolid to [facility]. Was told that they were out of antiviral meds. Wanted rx sent to her [facility]. There is no antiviral that's on her med list. Daughter states she is in day 3. Running fever, stuffy nose w/ cough. Want rx sent and wanted to [facility] MD is there anything else she can take for cough./lmb

## 2021-07-31 ENCOUNTER — Ambulatory Visit: Payer: Medicare Other | Admitting: Internal Medicine

## 2021-07-31 MED ORDER — NIRMATRELVIR/RITONAVIR (PAXLOVID)TABLET: 3.0000 | ORAL_TABLET | Freq: Two times a day (BID) | ORAL | 0 refills | Status: AC

## 2021-07-31 NOTE — Telephone Encounter (Signed)
I do not see a virtual appointment on file.  I will send a prescription for Paxlovid to [facility] Thanks

## 2021-08-01 NOTE — Telephone Encounter (Signed)
Daughter was notified MD resent to [place].Marland KitchenJohny Rowland

## 2021-10-02 ENCOUNTER — Encounter: Payer: Self-pay | Admitting: Internal Medicine

## 2021-10-03 ENCOUNTER — Other Ambulatory Visit: Payer: Self-pay

## 2021-10-03 ENCOUNTER — Encounter (INDEPENDENT_AMBULATORY_CARE_PROVIDER_SITE_OTHER): Payer: Medicare Other | Admitting: Ophthalmology

## 2021-10-03 DIAGNOSIS — H353132 Nonexudative age-related macular degeneration, bilateral, intermediate dry stage: Secondary | ICD-10-CM

## 2021-10-03 DIAGNOSIS — H43813 Vitreous degeneration, bilateral: Secondary | ICD-10-CM | POA: Diagnosis not present

## 2021-10-03 DIAGNOSIS — H59033 Cystoid macular edema following cataract surgery, bilateral: Secondary | ICD-10-CM | POA: Diagnosis not present

## 2021-10-08 ENCOUNTER — Other Ambulatory Visit: Payer: Self-pay | Admitting: Internal Medicine

## 2021-10-08 DIAGNOSIS — G5701 Lesion of sciatic nerve, right lower limb: Secondary | ICD-10-CM

## 2021-10-08 DIAGNOSIS — M546 Pain in thoracic spine: Secondary | ICD-10-CM

## 2021-10-08 DIAGNOSIS — R269 Unspecified abnormalities of gait and mobility: Secondary | ICD-10-CM

## 2021-10-10 ENCOUNTER — Other Ambulatory Visit: Payer: Self-pay

## 2021-10-10 ENCOUNTER — Encounter: Payer: Self-pay | Admitting: Internal Medicine

## 2021-10-10 ENCOUNTER — Ambulatory Visit (INDEPENDENT_AMBULATORY_CARE_PROVIDER_SITE_OTHER): Payer: Medicare Other | Admitting: Internal Medicine

## 2021-10-10 DIAGNOSIS — R269 Unspecified abnormalities of gait and mobility: Secondary | ICD-10-CM

## 2021-10-10 DIAGNOSIS — R109 Unspecified abdominal pain: Secondary | ICD-10-CM | POA: Diagnosis not present

## 2021-10-10 DIAGNOSIS — E871 Hypo-osmolality and hyponatremia: Secondary | ICD-10-CM

## 2021-10-10 DIAGNOSIS — N2889 Other specified disorders of kidney and ureter: Secondary | ICD-10-CM | POA: Diagnosis not present

## 2021-10-10 LAB — CBC WITH DIFFERENTIAL/PLATELET
Basophils Absolute: 0 10*3/uL (ref 0.0–0.1)
Basophils Relative: 0.6 % (ref 0.0–3.0)
Eosinophils Absolute: 0 10*3/uL (ref 0.0–0.7)
Eosinophils Relative: 0.7 % (ref 0.0–5.0)
HCT: 39.4 % (ref 36.0–46.0)
Hemoglobin: 13.2 g/dL (ref 12.0–15.0)
Lymphocytes Relative: 29.9 % (ref 12.0–46.0)
Lymphs Abs: 1.9 10*3/uL (ref 0.7–4.0)
MCHC: 33.4 g/dL (ref 30.0–36.0)
MCV: 89.9 fl (ref 78.0–100.0)
Monocytes Absolute: 0.4 10*3/uL (ref 0.1–1.0)
Monocytes Relative: 6.9 % (ref 3.0–12.0)
Neutro Abs: 3.9 10*3/uL (ref 1.4–7.7)
Neutrophils Relative %: 61.9 % (ref 43.0–77.0)
Platelets: 241 10*3/uL (ref 150.0–400.0)
RBC: 4.38 Mil/uL (ref 3.87–5.11)
RDW: 14.1 % (ref 11.5–15.5)
WBC: 6.3 10*3/uL (ref 4.0–10.5)

## 2021-10-10 LAB — COMPREHENSIVE METABOLIC PANEL
ALT: 12 U/L (ref 0–35)
AST: 15 U/L (ref 0–37)
Albumin: 4.2 g/dL (ref 3.5–5.2)
Alkaline Phosphatase: 50 U/L (ref 39–117)
BUN: 13 mg/dL (ref 6–23)
CO2: 27 mEq/L (ref 19–32)
Calcium: 9.1 mg/dL (ref 8.4–10.5)
Chloride: 97 mEq/L (ref 96–112)
Creatinine, Ser: 0.51 mg/dL (ref 0.40–1.20)
GFR: 84.06 mL/min (ref >60.00)
Glucose, Bld: 91 mg/dL (ref 70–99)
Potassium: 4 mEq/L (ref 3.5–5.1)
Sodium: 131 mEq/L — ABNORMAL LOW (ref 135–145)
Total Bilirubin: 0.6 mg/dL (ref 0.2–1.2)
Total Protein: 6.7 g/dL (ref 6.0–8.3)

## 2021-10-10 LAB — LIPASE: Lipase: 62 U/L — ABNORMAL HIGH (ref 11.0–59.0)

## 2021-10-10 NOTE — Progress Notes (Signed)
? ?Subjective:  ?Patient ID: Jessica Rowland, female    DOB: 1931/08/29  Age: 88 y.o. MRN: [id number] ? ?CC: Back Pain (Would like to recommend an exercise to help with this) and Abdominal Pain (Has been going on for 3 years, it has recently has gotten worse and become a pulling dull pain) ? ? ?HPI ?[name] presents for nausea - better, gait disorder, renal mass ? ?Outpatient Medications Prior to Visit  ?Medication Sig Dispense Refill  ? Cholecalciferol (VITAMIN D3) 2000 units capsule Take 1 capsule (2,000 Units total) by mouth daily. 100 capsule 3  ? diphenhydrAMINE (BENADRYL ALLERGY) 25 MG tablet Take 0.5-1 tablets (12.5-25 mg total) by mouth at bedtime as needed for sleep. 60 tablet 3  ? Ginkgo Biloba 120 MG CAPS Take 120 mg by mouth 3 (three) times daily.    ? lipase/protease/amylase (CREON) 36000 UNITS CPEP capsule Take 1 capsule (36,000 Units total) by mouth 3 (three) times daily before meals. Take with the first bite of food 180 capsule 3  ? ondansetron (ZOFRAN) 4 MG tablet Take 1 tablet (4 mg total) by mouth every 8 (eight) hours as needed for nausea or vomiting. 20 tablet 0  ? pantoprazole (PROTONIX) 40 MG tablet Take 1 tablet (40 mg total) by mouth daily. 90 tablet 1  ? sennosides-docusate sodium (SENOKOT-S) 8.6-50 MG tablet Take 1 tablet by mouth daily. 90 tablet 1  ? ?No facility-administered medications prior to visit.  ? ? ?ROS: ?Review of Systems  ?Constitutional:  Negative for activity change, appetite change, chills, fatigue and unexpected weight change.  ?HENT:  Negative for congestion, mouth sores and sinus pressure.   ?Eyes:  Negative for visual disturbance.  ?Respiratory:  Negative for cough and chest tightness.   ?Gastrointestinal:  Negative for abdominal pain and nausea.  ?Genitourinary:  Negative for difficulty urinating, frequency and vaginal pain.  ?Musculoskeletal:  Positive for arthralgias and gait problem. Negative for back pain.  ?Skin:  Negative for pallor and rash.   ?Neurological:  Negative for dizziness, tremors, weakness, numbness and headaches.  ?Psychiatric/Behavioral:  Negative for confusion and sleep disturbance.   ? ?Objective:  ?BP (!) 156/60 (BP Location: Left Arm, Patient Position: Sitting, Cuff Size: Normal)   Pulse 82   Temp 97.9 ?F (36.6 ?C) (Oral)   Ht 5' (1.524 m)   Wt 100 lb 6.4 oz (45.5 kg)   SpO2 99%   BMI 19.61 kg/m?  ? ?BP Readings from Last 3 Encounters:  ?10/10/21 (!) 156/60  ?06/20/21 (!) 152/58  ?05/17/21 (!) 142/70  ? ? ?Wt Readings from Last 3 Encounters:  ?10/10/21 100 lb 6.4 oz (45.5 kg)  ?05/17/21 97 lb 3.2 oz (44.1 kg)  ?03/28/21 97 lb 6.4 oz (44.2 kg)  ? ? ?Physical Exam ?Constitutional:   ?   General: She is not in acute distress. ?   Appearance: She is well-developed.  ?HENT:  ?   Head: Normocephalic.  ?   Right Ear: External ear normal.  ?   Left Ear: External ear normal.  ?   Nose: Nose normal.  ?Eyes:  ?   General:     ?   Right eye: No discharge.     ?   Left eye: No discharge.  ?   Conjunctiva/sclera: Conjunctivae normal.  ?   Pupils: Pupils are equal, round, and reactive to light.  ?Neck:  ?   Thyroid: No thyromegaly.  ?   Vascular: No JVD.  ?   Trachea: No tracheal deviation.  ?Cardiovascular:  ?  Rate and Rhythm: Normal rate and regular rhythm.  ?   Heart sounds: Normal heart sounds.  ?Pulmonary:  ?   Effort: No respiratory distress.  ?   Breath sounds: No stridor. No wheezing.  ?Abdominal:  ?   General: Bowel sounds are normal. There is no distension.  ?   Palpations: Abdomen is soft. There is no mass.  ?   Tenderness: There is no abdominal tenderness. There is no guarding or rebound.  ?Musculoskeletal:     ?   General: No tenderness.  ?   Cervical back: Normal range of motion and neck supple. No rigidity.  ?Lymphadenopathy:  ?   Cervical: No cervical adenopathy.  ?Skin: ?   Findings: No erythema or rash.  ?Neurological:  ?   Mental Status: She is oriented to person, place, and time.  ?   Cranial Nerves: No cranial nerve  deficit.  ?   Motor: No abnormal muscle tone.  ?   Coordination: Coordination normal.  ?   Deep Tendon Reflexes: Reflexes normal.  ?Psychiatric:     ?   Behavior: Behavior normal.     ?   Thought Content: Thought content normal.     ?   Judgment: Judgment normal.  ?Antalgic gait ? ?Lab Results  ?Component Value Date  ? WBC 6.9 05/17/2021  ? HGB 13.3 05/17/2021  ? HCT 40.5 05/17/2021  ? PLT 234.0 05/17/2021  ? GLUCOSE 97 05/17/2021  ? CHOL 259 (H) 03/16/2021  ? TRIG 203.0 (H) 03/16/2021  ? HDL 72.50 03/16/2021  ? LDLDIRECT 158.0 03/16/2021  ? ALT 17 05/17/2021  ? AST 19 05/17/2021  ? NA 133 (L) 05/17/2021  ? K 3.9 05/17/2021  ? CL 98 05/17/2021  ? CREATININE 0.53 05/17/2021  ? BUN 12 05/17/2021  ? CO2 24 05/17/2021  ? TSH 1.03 03/16/2021  ? ? ?CT Abdomen Pelvis W Contrast ? ?Result Date: 06/14/2021 ?CLINICAL DATA:  Right upper quadrant abdominal pain. EXAM: CT ABDOMEN AND PELVIS WITH CONTRAST TECHNIQUE: Multidetector CT imaging of the abdomen and pelvis was performed using the standard protocol following bolus administration of intravenous contrast. CONTRAST:  59mL ISOVUE-300 IOPAMIDOL (ISOVUE-300) INJECTION 61% COMPARISON:  None. FINDINGS: Lower chest: No acute abnormality. Hepatobiliary: No focal liver abnormality is seen. No gallstones, gallbladder wall thickening, or biliary dilatation. Pancreas: Unremarkable. No pancreatic ductal dilatation or surrounding inflammatory changes. Spleen: Normal in size without focal abnormality. Adrenals/Urinary Tract: Adrenal glands appear normal. 1.7 cm enhancing partially exophytic mass is seen arising anteriorly from lower pole of right kidney concerning for renal cell carcinoma. No hydronephrosis or renal obstruction is noted. Urinary bladder is unremarkable. Stomach/Bowel: Stomach is within normal limits. Appendix appears normal. No evidence of bowel wall thickening, distention, or inflammatory changes. Vascular/Lymphatic: Aortic atherosclerosis. No enlarged abdominal or  pelvic lymph nodes. Reproductive: Status post hysterectomy. No adnexal masses. Other: No abdominal wall hernia or abnormality. No abdominopelvic ascites. Musculoskeletal: Multilevel degenerative disc disease is noted in lower lumbar spine. No acute osseous abnormality is noted. IMPRESSION: 1.7 cm enhancing mass is seen arising anteriorly from right kidney concerning for renal cell carcinoma. Consultation with urology is recommended. These results will be called to the ordering clinician or representative by the Radiologist Assistant, and communication documented in the PACS or zVision Dashboard. Multilevel degenerative disc disease is noted in the lower lumbar spine. Aortic Atherosclerosis (ICD10-I70.0). Electronically Signed   By: Marijo Conception M.D.   On: 06/14/2021 16:27  ? ? ?Assessment & Plan:  ? ?Problem List  Items Addressed This Visit   ? ? Abdominal pain  ?  Doing better on diet ?  ?  ? Relevant Orders  ? Lipase  ? CBC with Differential/Platelet  ? Gait disorder  ?  Worse - start PT ?  ?  ? Hyponatremia  ?  Monitor Na ?  ?  ? Relevant Orders  ? Comprehensive metabolic panel  ? Renal mass, right  ?  F/u w/Urology in 4/23: seeing Dr. Alinda Money ?  ?  ?  ? ? ?No orders of the defined types were placed in this encounter. ?  ? ? ?Follow-up: Return in about 4 months (around 02/09/2022) for a follow-up visit. ? ?Walker Kehr, MD ?

## 2021-10-10 NOTE — Assessment & Plan Note (Signed)
Monitor Na ?

## 2021-10-10 NOTE — Assessment & Plan Note (Signed)
F/u w/Urology in 4/23: seeing Dr. Alinda Money ?

## 2021-10-10 NOTE — Assessment & Plan Note (Signed)
Doing better on diet ?

## 2021-10-10 NOTE — Assessment & Plan Note (Signed)
Worse - start PT ?

## 2021-10-11 ENCOUNTER — Ambulatory Visit: Payer: Medicare Other | Admitting: Internal Medicine

## 2021-11-01 NOTE — Therapy (Signed)
?OUTPATIENT PHYSICAL THERAPY THORACOLUMBAR EVALUATION ? ? ?Patient Name: Jessica Rowland ?MRN: [id number] ?DOB:1932/01/28, 88 y.o., female ?Today's Date: 11/03/2021 ? ? PT End of Session - 11/03/21 2056   ? ? Visit Number 1   ? Number of Visits 13   ? Date for PT Re-Evaluation 12/21/21   ? Authorization Type MEDICARE PART A AND B; MEDICAID OF [place]   ? Progress Note Due on Visit 10   ? PT Start Time [id number]   ? PT Stop Time 0940   ? PT Time Calculation (min) 48 min   ? Activity Tolerance Patient tolerated treatment well   ? Behavior During Therapy [facility] for tasks assessed/performed   ? ?  ?  ? ?  ? ? ?Past Medical History:  ?Diagnosis Date  ? Hyperlipidemia   ? Nodular basal cell carcinoma (BCC) 10/20/2017  ? Left Side Face (Cx3,5FU)  ? Varicose veins of bilateral lower extremities with pain   ? ?Past Surgical History:  ?Procedure Laterality Date  ? ABDOMINAL HYSTERECTOMY    ? ENDOVENOUS ABLATION SAPHENOUS VEIN W/ LASER Right 06/21/2020  ? endovenous laser ablation right greater saphenous vein and stab phlebectomy > 20 incisions right leg by Ruta Hinds MD   ? ?Patient Active Problem List  ? Diagnosis Date Noted  ? Renal mass, right 06/20/2021  ? Memory problem 05/17/2021  ? Nausea 03/28/2021  ? Constipation 03/28/2021  ? Hyponatremia 03/28/2021  ? Osteoporosis 03/26/2020  ? Acute midline thoracic back pain 03/23/2020  ? Herpes zoster 03/23/2020  ? Cold sore 03/23/2020  ? Varicose veins of right lower extremity with inflammation 01/14/2020  ? Chest wall pain 10/12/2019  ? Insomnia 06/10/2019  ? Cramp in lower leg 02/25/2019  ? Gait disorder 01/22/2019  ? Fatigue 01/22/2019  ? Herpes simplex 08/24/2018  ? Dyspnea 08/24/2018  ? Heart murmur 08/24/2018  ? H. pylori duodenitis 03/23/2018  ? Presbycusis of both ears 02/02/2018  ? Hearing loss 12/26/2017  ? Abdominal pain 12/26/2017  ? Carotid bruit 12/26/2017  ? Cholecystitis, chronic 08/26/2017  ? Skin cancer of face 05/15/2017  ? Dizzinesses 05/15/2017  ? Vision problems  05/15/2017  ? Piriformis syndrome of right side 03/13/2017  ? ? ?PCP: Plotnikov, Evie Lacks, MD ? ?REFERRING PROVIDER: Plotnikov, Evie Lacks, MD ? ?REFERRING DIAG: Acute midline thoracic back pain, Gait disorder, Piriformis syndrome of right side ? ?THERAPY DIAG:  ?Pain in thoracic spine ? ?Cervicalgia ? ?Other abnormalities of gait and mobility ? ?ONSET DATE: Chronic ? ?SUBJECTIVE:                                                                                                                                                                                          ? ?  SUBJECTIVE STATEMENT: ?Pt reports neck and mid back pain and R leg numbness with little pain. Pt notes her miedback bothers her the most and she I managing her R leg numbness. ? ?PERTINENT HISTORY:  ?Hx upper thoracic compression fracture ?Dizzinesses ?Vision problems ? ?PAIN:  ?Are you having pain? Yes: NPRS scale: 8/10 ?Pain location: neck and mid back ?Pain description: ache, sharp ?Aggravating factors: certain movements ?Relieving factors: Rest ? ? ?PRECAUTIONS: None ? ?WEIGHT BEARING RESTRICTIONS No ? ?FALLS:  ?Has patient fallen in last 6 months? Yes, Number of falls: 1  ?Pt reports she has a SPC but does not use ? ?LIVING ENVIRONMENT: ?Lives with: lives alone ?Lives in: House/apartment ?Stairs: Yes: External: 16 steps; can reach both ?Has following equipment at home: None ? ?OCCUPATION: retured ? ?PLOF: Independent with household mobility without device ? ?PATIENT GOALS To help with her neck and back pain ? ? ?OBJECTIVE:  ? ?DIAGNOSTIC FINDINGS:  ?IMPRESSION: ?Age-indeterminate compression fracture of an upper thoracic ?vertebral body, new since 01/22/2019 chest radiograph. Multilevel ?degenerative changes. ?  ?  ?Electronically Signed ?  By: Macy Mis M.D. ?  On: 03/24/2020 08:28 ? ?PATIENT SURVEYS:  ?FOTO TBA ? ?SCREENING FOR RED FLAGS: ?Bowel or bladder incontinence: No ?Spinal tumors: No ?Cauda equina syndrome: No ?Compression fracture:  Yes: possibly T5 ? ?COGNITION: ? Overall cognitive status: Within functional limits for tasks assessed   ?  ?SENSATION: ?Not tested ? ?MUSCLE LENGTH: ?Hamstrings: Right TBA deg; Left TBA deg ?Thomas test: Right TBA deg; Left TBA deg ?Piriformis Test R:TBA   L:TBA ? ?POSTURE:  ?Significant forward head and thoracic kyphosis c rounded shoulders ? ?PALPATION: ?TTP generally to the mid back ? ?CERVICAL ROM:  ? ?Active ROM A/PROM (deg) ?11/03/2021  ?Flexion 57  ?Extension 30  ?Right lateral flexion 31  ?Left lateral flexion 27 L cervical pain  ?Right rotation 50  ?Left rotation 50 L cervical pain  ? -Significantly decreased thoracic spine mobility ? ?UE ROM: ?  Grossly WNLs  ? ?UE MMT: ? Grossly WNLs and qual L and R ? ?CERVICAL SPECIAL TESTS:  ?Spurling's test: Negative ? ? ?FUNCTIONAL TESTS:  ?5 times sit to stand: TBA ?2 minute walk test: TBA ?Berg ? ?LE ROM: ?  TBA ?Active  Right ?11/03/2021 Left ?11/03/2021  ?Hip flexion    ?Hip extension    ?Hip abduction    ?Hip adduction    ?Hip internal rotation    ?Hip external rotation    ?Knee flexion    ?Knee extension    ?Ankle dorsiflexion    ?Ankle plantarflexion    ?Ankle inversion    ?Ankle eversion    ? (Blank rows = not tested) ? ?LE MMT: ?  TBA ?MMT Right ?11/03/2021 Left ?11/03/2021  ?Hip flexion    ?Hip extension    ?Hip abduction    ?Hip adduction    ?Hip internal rotation    ?Hip external rotation    ?Knee flexion    ?Knee extension    ?Ankle dorsiflexion    ?Ankle plantarflexion    ?Ankle inversion    ?Ankle eversion    ? (Blank rows = not tested) ? ?GAIT: ?Distance walked: 223f ?Assistive device utilized: None ?Level of assistance: SBA ?Comments: Decreased pace and step length ? ? ? ?TODAY'S TREATMENT ?11/02/21  ?- Seated Passive Cervical Retraction  10 reps 3 hold ?- Sidelying Thoracic Rotation with Open Book  5 reps 5 hold ?- Standing Shoulder Row with Anchored Resistance 10 reps RTB 2  hold ? ? ?PATIENT EDUCATION:  ?Education details: Eval findings, POC, HEP, to  use SPC for assist for gait outside of home ?Person educated: Patient ?Education method: Explanation, Demonstration, Tactile cues, Verbal cues, and Handouts ?Education comprehension: verbalized understanding, returned demonstration, verbal cues required, and tactile cues required ? ? ?HOME EXERCISE PROGRAM: ?Access Code: [facility] ?URL: [email or web address] ?Date: 11/03/2021 ?Prepared by: Gar Ponto ? ?Exercises ?- Seated Passive Cervical Retraction  - 6 x daily - 7 x weekly - 1 sets - 5 reps - 3 hold ?- Sidelying Thoracic Rotation with Open Book  - 2 x daily - 7 x weekly - 1 sets - 10 reps - 5 hold ?- Standing Shoulder Row with Anchored Resistance  - 2 x daily - 7 x weekly - 2 sets - 10 reps - 2 hold ? ?ASSESSMENT: ? ?CLINICAL IMPRESSION: ?Patient is a 88 y.o. F who was seen today for physical therapy evaluation and treatment for Acute midline thoracic back pain, Gait disorder, Piriformis syndrome of right side.  ? ? ?OBJECTIVE IMPAIRMENTS Abnormal gait, decreased activity tolerance, decreased balance, difficulty walking, decreased ROM, decreased strength, decreased safety awareness, increased muscle spasms, impaired sensation, postural dysfunction, and pain.  ? ?ACTIVITY LIMITATIONS cleaning, community activity, meal prep, laundry, and shopping.  ? ?PERSONAL FACTORS Age, Past/current experiences, Time since onset of injury/illness/exacerbation, and 3+ comorbidities:     Hx upper thoracic compression fracture ?Dizzinesses, Vision problems are also affecting patient's functional outcome.  ? ? ?REHAB POTENTIAL: Good ? ?CLINICAL DECISION MAKING: Evolving/moderate complexity ? ?EVALUATION COMPLEXITY: Moderate ? ? ?GOALS: ? ?SHORT TERM GOALS: Target date: 11/24/2021 ? ?Pt will be Ind in an initial HEP ?Baseline: Started on eval ?Goal status: INITIAL ? ?2.  Pt will voice understanding of mesures to decrease pain ?Baseline:  ?Goal status: INITIAL ? ? ?LONG TERM GOALS: Target date: 12/21/21 ? ?Increase pt's  cervical ROM by 10d for improved neck function ?Baseline: see flow sheets ?Goal status: INITIAL ? ?2.  Pt will demonstrated improve thoracic mobility ?Baseline: markedly decreased ?Goal status: INITIAL ? ?3.

## 2021-11-02 ENCOUNTER — Ambulatory Visit: Payer: Medicare Other | Attending: Internal Medicine

## 2021-11-02 ENCOUNTER — Other Ambulatory Visit: Payer: Self-pay

## 2021-11-02 DIAGNOSIS — G5701 Lesion of sciatic nerve, right lower limb: Secondary | ICD-10-CM | POA: Diagnosis not present

## 2021-11-02 DIAGNOSIS — M546 Pain in thoracic spine: Secondary | ICD-10-CM | POA: Insufficient documentation

## 2021-11-02 DIAGNOSIS — R2689 Other abnormalities of gait and mobility: Secondary | ICD-10-CM | POA: Insufficient documentation

## 2021-11-02 DIAGNOSIS — M542 Cervicalgia: Secondary | ICD-10-CM | POA: Diagnosis not present

## 2021-11-02 DIAGNOSIS — R269 Unspecified abnormalities of gait and mobility: Secondary | ICD-10-CM | POA: Diagnosis not present

## 2021-11-06 ENCOUNTER — Ambulatory Visit: Payer: Medicare Other

## 2021-11-06 ENCOUNTER — Other Ambulatory Visit: Payer: Self-pay

## 2021-11-06 DIAGNOSIS — M542 Cervicalgia: Secondary | ICD-10-CM

## 2021-11-06 DIAGNOSIS — M546 Pain in thoracic spine: Secondary | ICD-10-CM | POA: Diagnosis not present

## 2021-11-06 DIAGNOSIS — R2689 Other abnormalities of gait and mobility: Secondary | ICD-10-CM | POA: Diagnosis not present

## 2021-11-06 DIAGNOSIS — G5701 Lesion of sciatic nerve, right lower limb: Secondary | ICD-10-CM | POA: Diagnosis not present

## 2021-11-06 DIAGNOSIS — R269 Unspecified abnormalities of gait and mobility: Secondary | ICD-10-CM | POA: Diagnosis not present

## 2021-11-06 NOTE — Therapy (Signed)
?OUTPATIENT PHYSICAL THERAPY TREATMENT NOTE ? ? ?Patient Name: Jessica Rowland ?MRN: [id number] ?DOB:Jul 26, 1932, 88 y.o., female ?Today's Date: 11/06/2021 ? ?PCP: Plotnikov, Evie Lacks, MD ?REFERRING PROVIDER: Cassandria Anger, MD ? ? PT End of Session - 11/06/21 1736   ? ? Visit Number 2   ? Number of Visits 13   ? Date for PT Re-Evaluation 12/21/21   ? Authorization Type MEDICARE PART A AND B; MEDICAID OF [place]   ? Progress Note Due on Visit 10   ? PT Start Time 1507   ? PT Stop Time 1550   ? PT Time Calculation (min) 43 min   ? Activity Tolerance Patient tolerated treatment well   ? Behavior During Therapy [facility] for tasks assessed/performed   ? ?  ?  ? ?  ? ? ?Past Medical History:  ?Diagnosis Date  ? Hyperlipidemia   ? Nodular basal cell carcinoma (BCC) 10/20/2017  ? Left Side Face (Cx3,5FU)  ? Varicose veins of bilateral lower extremities with pain   ? ?Past Surgical History:  ?Procedure Laterality Date  ? ABDOMINAL HYSTERECTOMY    ? ENDOVENOUS ABLATION SAPHENOUS VEIN W/ LASER Right 06/21/2020  ? endovenous laser ablation right greater saphenous vein and stab phlebectomy > 20 incisions right leg by Ruta Hinds MD   ? ?Patient Active Problem List  ? Diagnosis Date Noted  ? Renal mass, right 06/20/2021  ? Memory problem 05/17/2021  ? Nausea 03/28/2021  ? Constipation 03/28/2021  ? Hyponatremia 03/28/2021  ? Osteoporosis 03/26/2020  ? Acute midline thoracic back pain 03/23/2020  ? Herpes zoster 03/23/2020  ? Cold sore 03/23/2020  ? Varicose veins of right lower extremity with inflammation 01/14/2020  ? Chest wall pain 10/12/2019  ? Insomnia 06/10/2019  ? Cramp in lower leg 02/25/2019  ? Gait disorder 01/22/2019  ? Fatigue 01/22/2019  ? Herpes simplex 08/24/2018  ? Dyspnea 08/24/2018  ? Heart murmur 08/24/2018  ? H. pylori duodenitis 03/23/2018  ? Presbycusis of both ears 02/02/2018  ? Hearing loss 12/26/2017  ? Abdominal pain 12/26/2017  ? Carotid bruit 12/26/2017  ? Cholecystitis, chronic 08/26/2017  ? Skin  cancer of face 05/15/2017  ? Dizzinesses 05/15/2017  ? Vision problems 05/15/2017  ? Piriformis syndrome of right side 03/13/2017  ? ? ?REFERRING DIAG: Acute midline thoracic back pain, Gait disorder, Piriformis syndrome of right side ? ?THERAPY DIAG:  ?Pain in thoracic spine ? ?Cervicalgia ? ?Other abnormalities of gait and mobility ? ?SUBJECTIVE:  ? ?SUBJECTIVE STATEMENT: ?Pt reports her neck and back have been feeling better. Pt notes completing the exs consistently. She reports she has not been using the [facility] outside of her home. ? ?               PAIN:  ?Are you having pain? Yes: NPRS scale: 3/10 ?Pain location: neck and mid back ?Pain description: ache, sharp ?Aggravating factors: certain movements ?Relieving factors: Rest ?  ?  ?PRECAUTIONS: Falls ?  ?WEIGHT BEARING RESTRICTIONS No ?  ?PATIENT GOALS To help with her neck and back pain ?  ?  ?OBJECTIVE:  ?  ?DIAGNOSTIC FINDINGS:  ?IMPRESSION: ?Age-indeterminate compression fracture of an upper thoracic ?vertebral body, new since 01/22/2019 chest radiograph. Multilevel ?degenerative changes. ?  ?  ?Electronically Signed ?  By: Macy Mis M.D. ?  On: 03/24/2020 08:28 ?  ?PATIENT SURVEYS:  ?FOTO TBA ?  ?SCREENING FOR RED FLAGS: ?Bowel or bladder incontinence: No ?Spinal tumors: No ?Cauda equina syndrome: No ?Compression fracture: Yes: possibly T5 ?  ?  COGNITION: ?          Overall cognitive status: Within functional limits for tasks assessed               ?           ?SENSATION: ?Not tested ?  ?MUSCLE LENGTH: ?Hamstrings: Right TBA deg; Left TBA deg ?Thomas test: Right TBA deg; Left TBA deg ?Piriformis Test R:TBA   L:TBA ?  ?POSTURE:  ?Significant forward head and thoracic kyphosis c rounded shoulders ?  ?PALPATION: ?TTP generally to the mid back ?  ?CERVICAL ROM:  ?  ?Active ROM A/PROM (deg) ?11/03/2021  ?Flexion 57  ?Extension 30  ?Right lateral flexion 31  ?Left lateral flexion 27 L cervical pain  ?Right rotation 50  ?Left rotation 50 L cervical pain  ?            -Significantly decreased thoracic spine mobility ?  ?UE ROM: ?                      Grossly WNLs  ?  ?UE MMT: ?          Grossly WNLs and qual L and R ?  ?CERVICAL SPECIAL TESTS:  ?Spurling's test: Negative ?  ?  ?FUNCTIONAL TESTS:  ?5 times sit to stand: 11/06/21=34.4 ?2 minute walk test: 11/06/21=390 ft ?Berg ?  ?LE ROM: ?                      TBA ?Active  Right ?11/03/2021 Left ?11/03/2021  ?Hip flexion      ?Hip extension      ?Hip abduction      ?Hip adduction      ?Hip internal rotation      ?Hip external rotation      ?Knee flexion      ?Knee extension      ?Ankle dorsiflexion      ?Ankle plantarflexion      ?Ankle inversion      ?Ankle eversion      ? (Blank rows = not tested) ?  ?LE MMT: ?                      TBA ?MMT Right ?11/03/2021 Left ?11/03/2021  ?Hip flexion      ?Hip extension      ?Hip abduction      ?Hip adduction      ?Hip internal rotation      ?Hip external rotation      ?Knee flexion      ?Knee extension      ?Ankle dorsiflexion      ?Ankle plantarflexion      ?Ankle inversion      ?Ankle eversion      ? (Blank rows = not tested) ?  ?GAIT: ?Distance walked: 240f ?Assistive device utilized: None ?Level of assistance: SBA ?Comments: Decreased pace and step length ?  ?  ?  ?TODAY'S TREATMENT; ? [facility]                                                DATE: 11/06/21 ?Therapeutic Exercise: ?Cervical retraction x10, 3' ?Scapular retraction x10, 3" ?Shoulder rows 2x10, 3" RTB ?Open book x10 5' each ?Bridging 2x10 ?Hip abduction 2x10, GTB ? ?Therapeutic Activity: ?5xSTSs hands  ?2MWT ? ?  Self Care: ?Pt was encouraged to use her SPC outside of her home ? ?11/02/21  ?- Seated Passive Cervical Retraction  10 reps 3 hold ?- Sidelying Thoracic Rotation with Open Book  5 reps 5 hold ?- Standing Shoulder Row with Anchored Resistance 10 reps RTB 2 hold ?  ?  ?PATIENT EDUCATION:  ?Education details: Eval findings, POC, HEP, to use SPC for assist for gait outside of home ?Person educated:  Patient ?Education method: Explanation, Demonstration, Tactile cues, Verbal cues, and Handouts ?Education comprehension: verbalized understanding, returned demonstration, verbal cues required, and tactile cues required ?  ?  ?HOME EXERCISE PROGRAM: ?Access Code: [facility] ?URL: [email or web address] ?Date: 11/03/2021 ?Prepared by: Gar Ponto ?  ?Exercises ?- Seated Passive Cervical Retraction  - 6 x daily - 7 x weekly - 1 sets - 5 reps - 3 hold ?- Sidelying Thoracic Rotation with Open Book  - 2 x daily - 7 x weekly - 1 sets - 10 reps - 5 hold ?- Standing Shoulder Row with Anchored Resistance  - 2 x daily - 7 x weekly - 2 sets - 10 reps - 2 hold ?  ?ASSESSMENT: ?  ?CLINICAL IMPRESSION: ?PT was completed for cervical and mid back mobility and posterior chain strengthening. Pt's functional mobility c 5xSTS and 2MWT were assessed and found to be decreased. LE strengthening therex were completed and added to the pt's HEP. Pt returned proper demonstration of all HEP. Pt has responded well re: neck and mid back pain to the initial therex. Pt tolerated today's PT session without adverse effects. ?  ?  ?OBJECTIVE IMPAIRMENTS Abnormal gait, decreased activity tolerance, decreased balance, difficulty walking, decreased ROM, decreased strength, decreased safety awareness, increased muscle spasms, impaired sensation, postural dysfunction, and pain.  ?  ?PERSONAL FACTORS Age, Past/current experiences, Time since onset of injury/illness/exacerbation, and 3+ comorbidities:     Hx upper thoracic compression fracture ?Dizzinesses, Vision problems are also affecting patient's functional outcome.  ?  ?  ?GOALS: ?  ?SHORT TERM GOALS: Target date: 11/24/2021 ?  ?Pt will be Ind in an initial HEP ?Baseline: Started on eval ?Goal status: INITIAL ?  ?2.  Pt will voice understanding of mesures to decrease pain ?Baseline:  ?Goal status: INITIAL ?  ?  ?LONG TERM GOALS: Target date: 12/21/21 ?  ?Increase pt's cervical ROM by 10d for  improved neck function ?Baseline: see flow sheets ?Goal status: INITIAL ?  ?2.  Pt will demonstrated improve thoracic mobility ?Baseline: markedly decreased ?Goal status: INITIAL ?  ?3.  Pt will report a decrease

## 2021-11-13 ENCOUNTER — Encounter: Payer: Medicare Other | Admitting: Physical Therapy

## 2021-11-14 NOTE — Therapy (Signed)
?OUTPATIENT PHYSICAL THERAPY TREATMENT NOTE ? ? ?Patient Name: Jessica Rowland ?MRN: [id number] ?DOB:07-Jun-1932, 88 y.o., female ?Today's Date: 11/15/2021 ? ?PCP: Plotnikov, Evie Lacks, MD ?REFERRING PROVIDER: Cassandria Anger, MD ? ? PT End of Session - 11/15/21 1501   ? ? Visit Number 3   ? Number of Visits 13   ? Date for PT Re-Evaluation 12/21/21   ? Progress Note Due on Visit 10   ? PT Start Time 1500   ? PT Stop Time 5621   ? PT Time Calculation (min) 45 min   ? Activity Tolerance Patient tolerated treatment well   ? Behavior During Therapy [facility] for tasks assessed/performed   ? ?  ?  ? ?  ? ? ? ?Past Medical History:  ?Diagnosis Date  ? Hyperlipidemia   ? Nodular basal cell carcinoma (BCC) 10/20/2017  ? Left Side Face (Cx3,5FU)  ? Varicose veins of bilateral lower extremities with pain   ? ?Past Surgical History:  ?Procedure Laterality Date  ? ABDOMINAL HYSTERECTOMY    ? ENDOVENOUS ABLATION SAPHENOUS VEIN W/ LASER Right 06/21/2020  ? endovenous laser ablation right greater saphenous vein and stab phlebectomy > 20 incisions right leg by Ruta Hinds MD   ? ?Patient Active Problem List  ? Diagnosis Date Noted  ? Renal mass, right 06/20/2021  ? Memory problem 05/17/2021  ? Nausea 03/28/2021  ? Constipation 03/28/2021  ? Hyponatremia 03/28/2021  ? Osteoporosis 03/26/2020  ? Acute midline thoracic back pain 03/23/2020  ? Herpes zoster 03/23/2020  ? Cold sore 03/23/2020  ? Varicose veins of right lower extremity with inflammation 01/14/2020  ? Chest wall pain 10/12/2019  ? Insomnia 06/10/2019  ? Cramp in lower leg 02/25/2019  ? Gait disorder 01/22/2019  ? Fatigue 01/22/2019  ? Herpes simplex 08/24/2018  ? Dyspnea 08/24/2018  ? Heart murmur 08/24/2018  ? H. pylori duodenitis 03/23/2018  ? Presbycusis of both ears 02/02/2018  ? Hearing loss 12/26/2017  ? Abdominal pain 12/26/2017  ? Carotid bruit 12/26/2017  ? Cholecystitis, chronic 08/26/2017  ? Skin cancer of face 05/15/2017  ? Dizzinesses 05/15/2017  ? Vision  problems 05/15/2017  ? Piriformis syndrome of right side 03/13/2017  ? ? ?REFERRING DIAG: Acute midline thoracic back pain, Gait disorder, Piriformis syndrome of right side ? ?THERAPY DIAG:  ?Pain in thoracic spine ? ?Cervicalgia ? ?Other abnormalities of gait and mobility ? ?SUBJECTIVE:  ? ?SUBJECTIVE STATEMENT: ?Pt reports the HEP is helping her back.  ? ?               PAIN:  ?Are you having pain? Yes: NPRS scale: 0/10 ?Pain location: neck and mid back ?Pain description: ache, sharp ?Aggravating factors: certain movements ?Relieving factors: Rest ?  ?  ?PRECAUTIONS: Falls ?  ?WEIGHT BEARING RESTRICTIONS No ?  ?PATIENT GOALS To help with her neck and back pain ?  ?  ?OBJECTIVE:  ?  ?DIAGNOSTIC FINDINGS:  ?IMPRESSION: ?Age-indeterminate compression fracture of an upper thoracic ?vertebral body, new since 01/22/2019 chest radiograph. Multilevel ?degenerative changes. ?  ?  ?Electronically Signed ?  By: Macy Mis M.D. ?  On: 03/24/2020 08:28 ?  ?PATIENT SURVEYS:  ?FOTO TBA ?  ?SCREENING FOR RED FLAGS: ?Bowel or bladder incontinence: No ?Spinal tumors: No ?Cauda equina syndrome: No ?Compression fracture: Yes: possibly T5 ?  ?COGNITION: ?          Overall cognitive status: Within functional limits for tasks assessed               ?           ?  SENSATION: ?Not tested ?  ?MUSCLE LENGTH: ?Hamstrings: Right TBA deg; Left TBA deg ?Thomas test: Right TBA deg; Left TBA deg ?Piriformis Test R:TBA   L:TBA ?  ?POSTURE:  ?Significant forward head and thoracic kyphosis c rounded shoulders ?  ?PALPATION: ?TTP generally to the mid back ?  ?CERVICAL ROM:  ?  ?Active ROM A/PROM (deg) ?11/03/2021  ?Flexion 57  ?Extension 30  ?Right lateral flexion 31  ?Left lateral flexion 27 L cervical pain  ?Right rotation 50  ?Left rotation 50 L cervical pain  ?           -Significantly decreased thoracic spine mobility ?  ?UE ROM: ?                      Grossly WNLs  ?  ?UE MMT: ?          Grossly WNLs and qual L and R ?  ?CERVICAL SPECIAL  TESTS:  ?Spurling's test: Negative ?  ?  ?FUNCTIONAL TESTS:  ?5 times sit to stand: 11/06/21=34.4 ?2 minute walk test: 11/06/21=390 ft ?Berg ?  ?LE ROM: ?                      TBA ?Active  Right ?11/03/2021 Left ?11/03/2021  ?Hip flexion      ?Hip extension      ?Hip abduction      ?Hip adduction      ?Hip internal rotation      ?Hip external rotation      ?Knee flexion      ?Knee extension      ?Ankle dorsiflexion      ?Ankle plantarflexion      ?Ankle inversion      ?Ankle eversion      ? (Blank rows = not tested) ?  ?LE MMT: ?                      TBA ?MMT Right ?11/03/2021 Left ?11/03/2021  ?Hip flexion      ?Hip extension      ?Hip abduction      ?Hip adduction      ?Hip internal rotation      ?Hip external rotation      ?Knee flexion      ?Knee extension      ?Ankle dorsiflexion      ?Ankle plantarflexion      ?Ankle inversion      ?Ankle eversion      ? (Blank rows = not tested) ?  ?GAIT: ?Distance walked: 288f ?Assistive device utilized: None ?Level of assistance: SBA ?Comments: Decreased pace and step length ?  ?  ?  ?TODAY'S TREATMENT; [facility]                                                DATE: 11/15[facility]21 ?Therapeutic Exercise: ?Cervical retraction x10, 3' ?Scapular retraction x10, 3" ?Shoulder rows 2x10, 3" RTB ?Open book x10 5' each ?Bridging 2x10 ?Hip abduction 2x10, GTB ?Hip add sets c ball squeeze x15 5" ?Piriformis stretch x3,15 sec (pt's version) ?STS 10x ?SL stance x3 20" each ?Updated HEP ? ? [facility]  [facility]: 11/06/21 ?Therapeutic Exercise: ?Cervical retraction x10, 3' ?Scapular retraction x10, 3" ?Shoulder rows 2x10, 3" RTB ?Open book x10 5' each ?Bridging 2x10 ?Hip abduction 2x10, GTB ? ?Therapeutic Activity: ?5xSTSs hands  ?2MWT ? ?Self Care: ?Pt was encouraged to use her SPC outside of her home ? ?11/02/21  ?- Seated Passive Cervical Retraction  10 reps 3 hold ?- Sidelying Thoracic Rotation with Open Book  5 reps 5 hold ?-  Standing Shoulder Row with Anchored Resistance 10 reps RTB 2 hold ?  ?  ?PATIENT EDUCATION:  ?Education details: Eval findings, POC, HEP, to use SPC for assist for gait outside of home ?Person educated: Patient ?Education method: Explanation, Demonstration, Tactile cues, Verbal cues, and Handouts ?Education comprehension: verbalized understanding, returned demonstration, verbal cues required, and tactile cues required ?  ?  ?HOME EXERCISE PROGRAM: ? Access Code: [facility] ?URL: [email or web address] ?Date: 11/15/2021 ?Prepared by: Gar Ponto ? ?Exercises ?- Seated Passive Cervical Retraction  - 6 x daily - 7 x weekly - 1 sets - 5 reps - 3 hold ?- Seated Scapular Retraction  - 6 x daily - 7 x weekly - 1 sets - 10 reps ?- Sidelying Thoracic Rotation with Open Book  - 2 x daily - 7 x weekly - 1 sets - 10 reps - 5 hold ?- Standing Shoulder Row with Anchored Resistance  - 2 x daily - 7 x weekly - 2 sets - 10 reps - 2 hold ?- Supine Bridge  - 2 x daily - 7 x weekly - 2 sets - 10 reps - 3 hold ?- Hooklying Clamshell with Resistance  - 2 x daily - 7 x weekly - 2 sets - 10 reps - 3 hold ?- Supine Hip Adduction Isometric with Ball  - 1 x daily - 7 x weekly - 2 sets - 10 reps - 5 hold ?- Sit to Stand Without Arm Support  - 1 x daily - 7 x weekly - 1 sets - 10 reps ?- Standing Single Leg Stance with Counter Support  - 1 x daily - 7 x weekly - 1 sets - 3 reps - 20 hold ? ? ?  ?ASSESSMENT: ?  ?CLINICAL IMPRESSION: ?PT participated in PT to address posture and mid back pain, and for LE strengthening/stretches to address balance and piriformis syndrome. Pt reports the HEP is helping her mid back pain. Strengthening/balance exs were progressed today with pt tolerating with adverse effects. Pt completed exs properly and were added to her HEP. Pt will continue to benefit from skilled PT to address impairments to optimize function with less pain. ?  ? OBJECTIVE IMPAIRMENTS Abnormal gait, decreased activity tolerance,  decreased balance, difficulty walking, decreased ROM, decreased strength, decreased safety awareness, increased muscle spasms, impaired sensation, postural dysfunction, and pain.  ?  ?PERSONAL FACTORS Age, Past/cur

## 2021-11-15 ENCOUNTER — Ambulatory Visit: Payer: Medicare Other | Attending: Internal Medicine

## 2021-11-15 DIAGNOSIS — R2689 Other abnormalities of gait and mobility: Secondary | ICD-10-CM | POA: Diagnosis not present

## 2021-11-15 DIAGNOSIS — M546 Pain in thoracic spine: Secondary | ICD-10-CM | POA: Diagnosis not present

## 2021-11-15 DIAGNOSIS — M542 Cervicalgia: Secondary | ICD-10-CM | POA: Diagnosis not present

## 2021-11-21 ENCOUNTER — Ambulatory Visit: Payer: Medicare Other | Admitting: Physical Therapy

## 2021-11-22 ENCOUNTER — Ambulatory Visit: Payer: Medicare Other | Admitting: Physical Therapy

## 2021-11-22 ENCOUNTER — Encounter: Payer: Self-pay | Admitting: Physical Therapy

## 2021-11-22 DIAGNOSIS — M546 Pain in thoracic spine: Secondary | ICD-10-CM | POA: Diagnosis not present

## 2021-11-22 DIAGNOSIS — M542 Cervicalgia: Secondary | ICD-10-CM

## 2021-11-22 DIAGNOSIS — R2689 Other abnormalities of gait and mobility: Secondary | ICD-10-CM | POA: Diagnosis not present

## 2021-11-22 NOTE — Therapy (Signed)
?OUTPATIENT PHYSICAL THERAPY TREATMENT NOTE ? ? ?Patient Name: Jessica Rowland ?MRN: [id number] ?DOB:[birth year], 88 y.o., female ?Today's Date: 11/22/2021 ? ?PCP: Plotnikov, Evie Lacks, MD ?REFERRING PROVIDER: Cassandria Anger, MD ? ? PT End of Session - 11/22/21 2947   ? ? Visit Number 4   ? Number of Visits 13   ? Date for PT Re-Evaluation 12/21/21   ? Authorization Type MEDICARE PART A AND B; MEDICAID OF [place]   ? Progress Note Due on Visit 10   ? PT Start Time [phone]   ? PT Stop Time 1015   ? PT Time Calculation (min) 41 min   ? ?  ?  ? ?  ? ? ? ?Past Medical History:  ?Diagnosis Date  ? Hyperlipidemia   ? Nodular basal cell carcinoma (BCC) 10/20/2017  ? Left Side Face (Cx3,5FU)  ? Varicose veins of bilateral lower extremities with pain   ? ?Past Surgical History:  ?Procedure Laterality Date  ? ABDOMINAL HYSTERECTOMY    ? ENDOVENOUS ABLATION SAPHENOUS VEIN W/ LASER Right 06/21/2020  ? endovenous laser ablation right greater saphenous vein and stab phlebectomy > 20 incisions right leg by Ruta Hinds MD   ? ?Patient Active Problem List  ? Diagnosis Date Noted  ? Renal mass, right 06/20/2021  ? Memory problem 05/17/2021  ? Nausea 03/28/2021  ? Constipation 03/28/2021  ? Hyponatremia 03/28/2021  ? Osteoporosis 03/26/2020  ? Acute midline thoracic back pain 03/23/2020  ? Herpes zoster 03/23/2020  ? Cold sore 03/23/2020  ? Varicose veins of right lower extremity with inflammation 01/14/2020  ? Chest wall pain 10/12/2019  ? Insomnia 06/10/2019  ? Cramp in lower leg 02/25/2019  ? Gait disorder 01/22/2019  ? Fatigue 01/22/2019  ? Herpes simplex 08/24/2018  ? Dyspnea 08/24/2018  ? Heart murmur 08/24/2018  ? H. pylori duodenitis 03/23/2018  ? Presbycusis of both ears 02/02/2018  ? Hearing loss 12/26/2017  ? Abdominal pain 12/26/2017  ? Carotid bruit 12/26/2017  ? Cholecystitis, chronic 08/26/2017  ? Skin cancer of face 05/15/2017  ? Dizzinesses 05/15/2017  ? Vision problems 05/15/2017  ? Piriformis syndrome of right side  03/13/2017  ? ? ?REFERRING DIAG: Acute midline thoracic back pain, Gait disorder, Piriformis syndrome of right side   ?THERAPY DIAG:  ?Pain in thoracic spine ? ?Other abnormalities of gait and mobility ? ?Cervicalgia ? ?PERTINENT HISTORY:  ?Hx upper thoracic compression fracture ?Dizzinesses ?Vision problems ? ?SUBJECTIVE:  ? ?SUBJECTIVE STATEMENT: ? Pt arrives requesting additional stretches for her thoracic spine and has a handwritten  Xray result from 03/2020 which shows a compression fracture. ? ?               PAIN:  ?Are you having pain? Yes: NPRS scale: 5/10 ?Pain location: mid back ?Pain description: ache, sharp ?Aggravating factors: certain movements ?Relieving factors: Rest ?  ?  ?PRECAUTIONS: Falls ?  ?WEIGHT BEARING RESTRICTIONS No ?  ?PATIENT GOALS To help with her neck and back pain ?  ?  ?OBJECTIVE:  ?  ?DIAGNOSTIC FINDINGS:  ?IMPRESSION: ?Age-indeterminate compression fracture of an upper thoracic ?vertebral body, new since 01/22/2019 chest radiograph. Multilevel ?degenerative changes. ?  ?  ?Electronically Signed ?  By: Macy Mis M.D. ?  On: 03/24/2020 08:28 ?  ?PATIENT SURVEYS:  ?FOTO TBA ?  ?SCREENING FOR RED FLAGS: ?Bowel or bladder incontinence: No ?Spinal tumors: No ?Cauda equina syndrome: No ?Compression fracture: Yes: possibly T5 ?  ?COGNITION: ?  Overall cognitive status: Within functional limits for tasks assessed               ?           ?SENSATION: ?Not tested ?  ?MUSCLE LENGTH: ?Hamstrings: Right TBA deg; Left TBA deg ?Thomas test: Right TBA deg; Left TBA deg ?Piriformis Test R:TBA   L:TBA ?  ?POSTURE:  ?Significant forward head and thoracic kyphosis c rounded shoulders ?  ?PALPATION: ?TTP generally to the mid back ?  ?CERVICAL ROM:  ?  ?Active ROM A/PROM (deg) ?11/03/2021  ?Flexion 57  ?Extension 30  ?Right lateral flexion 31  ?Left lateral flexion 27 L cervical pain  ?Right rotation 50  ?Left rotation 50 L cervical pain  ?           -Significantly decreased thoracic spine  mobility ?  ?UE ROM: ?                      Grossly WNLs  ?  ?UE MMT: ?          Grossly WNLs and qual L and R ?  ?CERVICAL SPECIAL TESTS:  ?Spurling's test: Negative ?  ?  ?FUNCTIONAL TESTS:  ?5 times sit to stand: 11/06/21=34.4 ?2 minute walk test: 11/06/21=390 ft ?Berg ?  ?LE ROM: ?                      TBA ?Active  Right ?11/03/2021 Left ?11/03/2021  ?Hip flexion      ?Hip extension      ?Hip abduction      ?Hip adduction      ?Hip internal rotation      ?Hip external rotation      ?Knee flexion      ?Knee extension      ?Ankle dorsiflexion      ?Ankle plantarflexion      ?Ankle inversion      ?Ankle eversion      ? (Blank rows = not tested) ?  ?LE MMT: ?                      TBA ?MMT Right ?11/03/2021 Left ?11/03/2021  ?Hip flexion      ?Hip extension      ?Hip abduction      ?Hip adduction      ?Hip internal rotation      ?Hip external rotation      ?Knee flexion      ?Knee extension      ?Ankle dorsiflexion      ?Ankle plantarflexion      ?Ankle inversion      ?Ankle eversion      ? (Blank rows = not tested) ?  ?GAIT: ?Distance walked: 249f ?Assistive device utilized: None ?Level of assistance: SBA ?Comments: Decreased pace and step length ?  ?  ?  ?TODAY'S TREATMENT; ?[facility]                                                [facility]: 11[facility]21 ?Therapeutic Exercise: ?Decompression hang from pull up bar 30 sec x 3 ?Shoulder Rows 5 sec x 15 Green ?Shoulder extension red band x 15 ?Pec stretch in doorway 3 x 30 sec ?Supine decompression series- head press, shoulder press, leg press, leg lengthener.  ?  Open book x10 5' each ?Bridging 2x10 ?Hip abduction 2x10, GTB ?STS 10x- cues for control  ? ? ? ?[facility] Adult PT Treatment:                                                DATE: 11/15/21 ?Therapeutic Exercise: ?Cervical retraction x10, 3' ?Scapular retraction x10, 3" ?Shoulder rows 2x10, 3" RTB ?Open book x10 5' each ?Bridging 2x10 ?Hip abduction 2x10, GTB ?Hip add sets c ball squeeze x15 5" ?Piriformis stretch  x3,15 sec (pt's version) ?STS 10x ?SL stance x3 20" each ?Updated HEP ? ? [place] Adult PT Treatment:                                                DATE: 11/06/21 ?Therapeutic Exercise: ?Cervical retraction x10, 3' ?Scapular retraction x10, 3" ?Shoulder rows 2x10, 3" RTB ?Open book x10 5' each ?Bridging 2x10 ?Hip abduction 2x10, GTB ? ?Therapeutic Activity: ?5xSTSs hands  ?2MWT ? ?Self Care: ?Pt was encouraged to use her SPC outside of her home ? ?11/02/21  ?- Seated Passive Cervical Retraction  10 reps 3 hold ?- Sidelying Thoracic Rotation with Open Book  5 reps 5 hold ?- Standing Shoulder Row with Anchored Resistance 10 reps RTB 2 hold ?  ?  ?PATIENT EDUCATION:  ?Education details: Eval findings, POC, HEP, to use SPC for assist for gait outside of home ?Person educated: Patient ?Education method: Explanation, Demonstration, Tactile cues, Verbal cues, and Handouts ?Education comprehension: verbalized understanding, returned demonstration, verbal cues required, and tactile cues required ?  ?  ?HOME EXERCISE PROGRAM: ?Access Code: [facility] ?URL: [email or web address] ?Date: 11/22/2021 ?Prepared by: Hessie Diener ? ?Exercises ?- Seated Passive Cervical Retraction  - 6 x daily - 7 x weekly - 1 sets - 5 reps - 3 hold ?- Seated Scapular Retraction  - 6 x daily - 7 x weekly - 1 sets - 10 reps ?- Sidelying Thoracic Rotation with Open Book  - 2 x daily - 7 x weekly - 1 sets - 10 reps - 5 hold ?- Standing Shoulder Row with Anchored Resistance  - 2 x daily - 7 x weekly - 2 sets - 10 reps - 2 hold ?- Supine Bridge  - 2 x daily - 7 x weekly - 2 sets - 10 reps - 3 hold ?- Hooklying Clamshell with Resistance  - 2 x daily - 7 x weekly - 2 sets - 10 reps - 3 hold ?- Supine Hip Adduction Isometric with Ball  - 1 x daily - 7 x weekly - 2 sets - 10 reps - 5 hold ?- Sit to Stand Without Arm Support  - 1 x daily - 7 x weekly - 1 sets - 10 reps ?- Standing Single Leg Stance with Counter Support  - 1 x daily - 7 x weekly - 1  sets - 3 reps - 20 hold ?- Doorway Pec Stretch at 90 Degrees Abduction  - 1 x daily - 7 x weekly - 1 sets - 3 reps - 20-30 hold ? ? ?  ?ASSESSMENT: ?  ?CLINICAL IMPRESSION: ?Pt arrives requesting additional s

## 2021-11-23 ENCOUNTER — Ambulatory Visit: Payer: Medicare Other | Admitting: Physical Therapy

## 2021-11-28 ENCOUNTER — Ambulatory Visit: Payer: Medicare Other | Admitting: Physical Therapy

## 2021-11-28 ENCOUNTER — Encounter: Payer: Self-pay | Admitting: Physical Therapy

## 2021-11-28 DIAGNOSIS — R2689 Other abnormalities of gait and mobility: Secondary | ICD-10-CM | POA: Diagnosis not present

## 2021-11-28 DIAGNOSIS — M546 Pain in thoracic spine: Secondary | ICD-10-CM | POA: Diagnosis not present

## 2021-11-28 DIAGNOSIS — M542 Cervicalgia: Secondary | ICD-10-CM

## 2021-11-28 NOTE — Therapy (Addendum)
OUTPATIENT PHYSICAL THERAPY TREATMENT NOTE/Discharge   Patient Name: Jessica Rowland MRN: [id number] DOB:30-Jun-1932, 88 y.o., female Today's Date: 11/28/2021  PCP: Cassandria Anger, MD REFERRING PROVIDER: Cassandria Anger, MD   PT End of Session - 11/28/21 1105     Visit Number 5    Number of Visits 13    Date for PT Re-Evaluation 12/21/21    Authorization Type MEDICARE PART A AND B; MEDICAID OF [place]    Progress Note Due on Visit 10    PT Start Time 1100    PT Stop Time 1583    PT Time Calculation (min) 45 min              Past Medical History:  Diagnosis Date   Hyperlipidemia    Nodular basal cell carcinoma (BCC) 10/20/2017   Left Side Face (Cx3,5FU)   Varicose veins of bilateral lower extremities with pain    Past Surgical History:  Procedure Laterality Date   ABDOMINAL HYSTERECTOMY     ENDOVENOUS ABLATION SAPHENOUS VEIN W/ LASER Right 06/21/2020   endovenous laser ablation right greater saphenous vein and stab phlebectomy > 20 incisions right leg by Ruta Hinds MD    Patient Active Problem List   Diagnosis Date Noted   Renal mass, right 06/20/2021   Memory problem 05/17/2021   Nausea 03/28/2021   Constipation 03/28/2021   Hyponatremia 03/28/2021   Osteoporosis 03/26/2020   Acute midline thoracic back pain 03/23/2020   Herpes zoster 03/23/2020   Cold sore 03/23/2020   Varicose veins of right lower extremity with inflammation 01/14/2020   Chest wall pain 10/12/2019   Insomnia 06/10/2019   Cramp in lower leg 02/25/2019   Gait disorder 01/22/2019   Fatigue 01/22/2019   Herpes simplex 08/24/2018   Dyspnea 08/24/2018   Heart murmur 08/24/2018   H. pylori duodenitis 03/23/2018   Presbycusis of both ears 02/02/2018   Hearing loss 12/26/2017   Abdominal pain 12/26/2017   Carotid bruit 12/26/2017   Cholecystitis, chronic 08/26/2017   Skin cancer of face 05/15/2017   Dizzinesses 05/15/2017   Vision problems 05/15/2017   Piriformis syndrome of  right side 03/13/2017    REFERRING DIAG: Acute midline thoracic back pain, Gait disorder, Piriformis syndrome of right side   THERAPY DIAG:  Pain in thoracic spine  Other abnormalities of gait and mobility  Cervicalgia  PERTINENT HISTORY:  Hx upper thoracic compression fracture Dizzinesses Vision problems  SUBJECTIVE:   SUBJECTIVE STATEMENT: Pt reports pain in mid back. No neck pain                 PAIN:  Are you having pain? Yes: NPRS scale: 4-5/10 Pain location: mid back Pain description: ache, sharp Aggravating factors: certain movements Relieving factors: Rest     PRECAUTIONS: Falls   WEIGHT BEARING RESTRICTIONS No   PATIENT GOALS To help with her neck and back pain     OBJECTIVE:    DIAGNOSTIC FINDINGS:  IMPRESSION: Age-indeterminate compression fracture of an upper thoracic vertebral body, new since 01/22/2019 chest radiograph. Multilevel degenerative changes.     Electronically Signed   By: Macy Mis M.D.   On: 03/24/2020 08:28   PATIENT SURVEYS:  FOTO TBA- not captured- language barrier    SCREENING FOR RED FLAGS: Bowel or bladder incontinence: No Spinal tumors: No Cauda equina syndrome: No Compression fracture: Yes: possibly T5   COGNITION:           Overall cognitive status: Within functional limits for tasks assessed  SENSATION: Not tested   MUSCLE LENGTH: 11/28/21: Hamstrings: Right 78 deg; Left 83 deg 11/28/21: Marcello Moores test: Right 20 deg; Left 20 deg 11/28/21: Piriformis Test R: WFL and equal   L: WFL and equal bilat   POSTURE:  Significant forward head and thoracic kyphosis c rounded shoulders   PALPATION: TTP generally to the mid back   CERVICAL ROM:    Active ROM A/PROM (deg) 11/03/2021  Flexion 57  Extension 30  Right lateral flexion 31  Left lateral flexion 27 L cervical pain  Right rotation 50  Left rotation 50 L cervical pain             -Significantly decreased thoracic spine mobility    UE ROM:                       Grossly WNLs    UE MMT:           Grossly WNLs and qual L and R   CERVICAL SPECIAL TESTS:  Spurling's test: Negative     FUNCTIONAL TESTS:  5 times sit to stand: 11/06/21=34.4: 17.6 sec 11/25/21 (bariatric mat table , no UE)  2 minute walk test: 11/06/21=390 ft Berg   LE ROM:                       TBA Active  Right 11/03/2021 Left 11/03/2021  Hip flexion      Hip extension      Hip abduction      Hip adduction      Hip internal rotation      Hip external rotation      Knee flexion      Knee extension      Ankle dorsiflexion      Ankle plantarflexion      Ankle inversion      Ankle eversion       (Blank rows = not tested)   LE MMT:                       TBA MMT Right 11/03/2021 Left 11/03/2021 Right 11/28/21 Left 11/28/21  Hip flexion        Hip extension     4+/5 4+/5  Hip abduction     4-/5 4-/5  Hip adduction        Hip internal rotation        Hip external rotation        Knee flexion        Knee extension        Ankle dorsiflexion        Ankle plantarflexion        Ankle inversion        Ankle eversion         (Blank rows = not tested)   GAIT: Distance walked: 281f Assistive device utilized: None Level of assistance: SBA Comments: Decreased pace and step length       TODAY'S TREATMENT; [facility] Adult PT Treatment:                                                DATE: 11/28/21 Therapeutic Exercise: Hamstring stretch  Piriformis stretch Hip abduction x 10  17.6 sec 11/25/21 (bariatric mat table , no UE)  Hang from squat rack 20 sec x 3 Tandem  stance 3 sec best Semi tandem 12 sec best    [facility] Adult PT Treatment:                                                DATE: 11/22/21 Therapeutic Exercise: Decompression hang from pull up bar 30 sec x 3 Shoulder Rows 5 sec x 15 Green Shoulder extension red band x 15 Pec stretch in doorway 3 x 30 sec Supine decompression series- head press, shoulder press, leg press, leg lengthener.   Open book x10 5' each Bridging 2x10 Hip abduction 2x10, GTB STS 10x- cues for control     [facility] Adult PT Treatment:                                                DATE: 11/15/21 Therapeutic Exercise: Cervical retraction x10, 3' Scapular retraction x10, 3" Shoulder rows 2x10, 3" RTB Open book x10 5' each Bridging 2x10 Hip abduction 2x10, GTB Hip add sets c ball squeeze x15 5" Piriformis stretch x3,15 sec (pt's version) STS 10x SL stance x3 20" each Updated HEP   [facility] Adult PT Treatment:                                                DATE: 11/06/21 Therapeutic Exercise: Cervical retraction x10, 3' Scapular retraction x10, 3" Shoulder rows 2x10, 3" RTB Open book x10 5' each Bridging 2x10 Hip abduction 2x10, GTB  Therapeutic Activity: 5xSTSs hands  2MWT  Self Care: Pt was encouraged to use her SPC outside of her home  11/02/21  - Seated Passive Cervical Retraction  10 reps 3 hold - Sidelying Thoracic Rotation with Open Book  5 reps 5 hold - Standing Shoulder Row with Anchored Resistance 10 reps RTB 2 hold     PATIENT EDUCATION:  Education details: Eval findings, POC, HEP, to use SPC for assist for gait outside of home Person educated: Patient Education method: Explanation, Demonstration, Tactile cues, Verbal cues, and Handouts Education comprehension: verbalized understanding, returned demonstration, verbal cues required, and tactile cues required     HOME EXERCISE PROGRAM: Access Code: [place],The URL: [email or web address] Date: 11/22/2021 Prepared by: Hessie Diener  Exercises - Seated Passive Cervical Retraction  - 6 x daily - 7 x weekly - 1 sets - 5 reps - 3 hold - Seated Scapular Retraction  - 6 x daily - 7 x weekly - 1 sets - 10 reps - Sidelying Thoracic Rotation with Open Book  - 2 x daily - 7 x weekly - 1 sets - 10 reps - 5 hold - Standing Shoulder Row with Anchored Resistance  - 2 x daily - 7 x weekly - 2 sets - 10 reps - 2 hold - Supine  Bridge  - 2 x daily - 7 x weekly - 2 sets - 10 reps - 3 hold - Hooklying Clamshell with Resistance  - 2 x daily - 7 x weekly - 2 sets - 10 reps - 3 hold - Supine Hip Adduction Isometric with Ball  - 1 x daily - 7 x weekly - 2 sets - 10 reps -  5 hold - Sit to Stand Without Arm Support  - 1 x daily - 7 x weekly - 1 sets - 10 reps - Standing Single Leg Stance with Counter Support  - 1 x daily - 7 x weekly - 1 sets - 3 reps - 20 hold - Doorway Pec Stretch at 90 Degrees Abduction  - 1 x daily - 7 x weekly - 1 sets - 3 reps - 20-30 hold     ASSESSMENT:   CLINICAL IMPRESSION: Pt reports mid back pain continues but she believes exercises are helpful. She is hanging from the door trim at home and uses a bench to climb up on. She was cautioned to be extra careful climbing on the bench. She demonstrates a prone exercise she does for her neck and she lifted chest, had and arms. . She tolerated session without adverse effects. Increased time today due to difficulties with interpreter video quality. Captured tandem stance and semi tandem stance baselines.  Pt will continue to benefit from skilled PT to address impairments to optimize function with less pain.    OBJECTIVE IMPAIRMENTS Abnormal gait, decreased activity tolerance, decreased balance, difficulty walking, decreased ROM, decreased strength, decreased safety awareness, increased muscle spasms, impaired sensation, postural dysfunction, and pain.    PERSONAL FACTORS Age, Past/current experiences, Time since onset of injury/illness/exacerbation, and 3+ comorbidities:     Hx upper thoracic compression fracture Dizzinesses, Vision problems are also affecting patient's functional outcome.      GOALS:   SHORT TERM GOALS: Target date: 11/24/2021   Pt will be Ind in an initial HEP Baseline: Started on eval Goal status: MET    2.  Pt will voice understanding of mesures to decrease pain Baseline:  Goal status: MET     LONG TERM GOALS: Target date:  12/21/21   Increase pt's cervical ROM by 10d for improved neck function Baseline: see flow sheets Goal status: INITIAL   2.  Pt will demonstrated improve thoracic mobility Baseline: markedly decreased Goal status: INITIAL   3.  Pt will report a decrease in her neck/mid back pain to 4 or less with daily activites  Baseline: 8/10 Goal status: INITIAL   4.  Pt will be Ind in a final HEP to maintain achieved LOF Baseline: started on eval Goal status: INITIAL   5.  See LTGs associated with balance, function, and L piriformis syndrome as assessed Baseline:  Goal status: INITIAL   PLAN: PT FREQUENCY: 2x/week   PT DURATION: 6 weeks   PLANNED INTERVENTIONS: Therapeutic exercises, Therapeutic activity, Neuromuscular re-education, Balance training, Gait training, Patient/Family education, Joint mobilization, Dry Needling, Electrical stimulation, Spinal mobilization, Cryotherapy, Moist heat, Taping, Ultrasound, Ionotophoresis 69m/ml Dexamethasone, and Manual therapy.   PLAN FOR NEXT SESSION: measure ROM, she liked hanging from the pull up bar for decompression.  assess balance, function, and piriformis syndrome as indicated, assess response to HEP    JHessie Diener PTA 11/28/21 12:22 PM Phone: [phone]: [phone]  PHYSICAL THERAPY DISCHARGE SUMMARY  Visits from Start of Care: 5  Current functional level related to goals / functional outcomes: See Clinical Impression and goals   Remaining deficits: See Clinical Impression and goals   Education / Equipment: HEP   Patient agrees to discharge. Patient goals were partially met. Patient is being discharged due to not returning since the last visit.  Allen Ralls MS, PT 01/23/22 2:58 PM

## 2021-11-30 ENCOUNTER — Ambulatory Visit: Payer: Medicare Other | Admitting: Physical Therapy

## 2021-11-30 ENCOUNTER — Telehealth: Payer: Self-pay | Admitting: Internal Medicine

## 2021-11-30 NOTE — Telephone Encounter (Signed)
Left message for patient to call back to schedule Medicare Annual Wellness Visit  ? ?No hx of AWV eligible as of 02/09/18 ? ?Please schedule at anytime with [facility] if patient calls the office back.   ? ?45 Minutes appointment  ? ?Any questions, please call me at [phone]  ?

## 2021-12-05 ENCOUNTER — Ambulatory Visit: Payer: Medicare Other | Admitting: Physical Therapy

## 2021-12-07 ENCOUNTER — Ambulatory Visit: Payer: Medicare Other | Admitting: Physical Therapy

## 2021-12-12 ENCOUNTER — Ambulatory Visit: Payer: Medicare Other | Admitting: Physical Therapy

## 2021-12-14 ENCOUNTER — Ambulatory Visit: Payer: Medicare Other | Admitting: Physical Therapy

## 2021-12-19 ENCOUNTER — Ambulatory Visit: Payer: Medicare Other | Admitting: Physical Therapy

## 2022-01-09 DIAGNOSIS — D49511 Neoplasm of unspecified behavior of right kidney: Secondary | ICD-10-CM | POA: Diagnosis not present

## 2022-01-14 DIAGNOSIS — D49511 Neoplasm of unspecified behavior of right kidney: Secondary | ICD-10-CM | POA: Diagnosis not present

## 2022-01-14 DIAGNOSIS — N289 Disorder of kidney and ureter, unspecified: Secondary | ICD-10-CM | POA: Diagnosis not present

## 2022-01-14 DIAGNOSIS — C641 Malignant neoplasm of right kidney, except renal pelvis: Secondary | ICD-10-CM | POA: Diagnosis not present

## 2022-01-23 DIAGNOSIS — D49511 Neoplasm of unspecified behavior of right kidney: Secondary | ICD-10-CM | POA: Diagnosis not present

## 2022-04-03 ENCOUNTER — Encounter (INDEPENDENT_AMBULATORY_CARE_PROVIDER_SITE_OTHER): Payer: Medicare Other | Admitting: Ophthalmology

## 2022-04-03 DIAGNOSIS — H59033 Cystoid macular edema following cataract surgery, bilateral: Secondary | ICD-10-CM

## 2022-04-03 DIAGNOSIS — H353132 Nonexudative age-related macular degeneration, bilateral, intermediate dry stage: Secondary | ICD-10-CM | POA: Diagnosis not present

## 2022-04-03 DIAGNOSIS — H43813 Vitreous degeneration, bilateral: Secondary | ICD-10-CM | POA: Diagnosis not present

## 2022-05-03 DIAGNOSIS — Z23 Encounter for immunization: Secondary | ICD-10-CM | POA: Diagnosis not present

## 2022-05-09 ENCOUNTER — Ambulatory Visit: Payer: Medicare Other | Admitting: Internal Medicine

## 2022-05-13 ENCOUNTER — Other Ambulatory Visit: Payer: Self-pay | Admitting: Internal Medicine

## 2022-05-13 ENCOUNTER — Ambulatory Visit (INDEPENDENT_AMBULATORY_CARE_PROVIDER_SITE_OTHER): Payer: Medicare Other | Admitting: Internal Medicine

## 2022-05-13 ENCOUNTER — Encounter: Payer: Self-pay | Admitting: Internal Medicine

## 2022-05-13 VITALS — BP 152/78 | HR 91 | Temp 97.9°F | Ht 60.0 in | Wt 94.4 lb

## 2022-05-13 DIAGNOSIS — R5383 Other fatigue: Secondary | ICD-10-CM

## 2022-05-13 DIAGNOSIS — R269 Unspecified abnormalities of gait and mobility: Secondary | ICD-10-CM

## 2022-05-13 DIAGNOSIS — M810 Age-related osteoporosis without current pathological fracture: Secondary | ICD-10-CM

## 2022-05-13 DIAGNOSIS — R631 Polydipsia: Secondary | ICD-10-CM

## 2022-05-13 DIAGNOSIS — E871 Hypo-osmolality and hyponatremia: Secondary | ICD-10-CM | POA: Diagnosis not present

## 2022-05-13 DIAGNOSIS — N2889 Other specified disorders of kidney and ureter: Secondary | ICD-10-CM

## 2022-05-13 DIAGNOSIS — R202 Paresthesia of skin: Secondary | ICD-10-CM | POA: Diagnosis not present

## 2022-05-13 DIAGNOSIS — H9193 Unspecified hearing loss, bilateral: Secondary | ICD-10-CM

## 2022-05-13 LAB — URINALYSIS
Bilirubin Urine: NEGATIVE
Hgb urine dipstick: NEGATIVE
Ketones, ur: NEGATIVE
Leukocytes,Ua: NEGATIVE
Nitrite: NEGATIVE
Specific Gravity, Urine: <=1.005 — AB (ref 1.000–1.030)
Total Protein, Urine: NEGATIVE
Urine Glucose: NEGATIVE
Urobilinogen, UA: 0.2 (ref 0.0–1.0)
pH: 7 (ref 5.0–8.0)

## 2022-05-13 LAB — COMPREHENSIVE METABOLIC PANEL
ALT: 17 U/L (ref 0–35)
AST: 17 U/L (ref 0–37)
Albumin: 4.2 g/dL (ref 3.5–5.2)
Alkaline Phosphatase: 48 U/L (ref 39–117)
BUN: 9 mg/dL (ref 6–23)
CO2: 25 mEq/L (ref 19–32)
Calcium: 9.1 mg/dL (ref 8.4–10.5)
Chloride: 96 mEq/L (ref 96–112)
Creatinine, Ser: 0.53 mg/dL (ref 0.40–1.20)
GFR: 82.94 mL/min (ref >60.00)
Glucose, Bld: 109 mg/dL — ABNORMAL HIGH (ref 70–99)
Potassium: 3.1 mEq/L — ABNORMAL LOW (ref 3.5–5.1)
Sodium: 130 mEq/L — ABNORMAL LOW (ref 135–145)
Total Bilirubin: 0.7 mg/dL (ref 0.2–1.2)
Total Protein: 6.7 g/dL (ref 6.0–8.3)

## 2022-05-13 LAB — CBC WITH DIFFERENTIAL/PLATELET
Basophils Absolute: 0 10*3/uL (ref 0.0–0.1)
Basophils Relative: 0.8 % (ref 0.0–3.0)
Eosinophils Absolute: 0 10*3/uL (ref 0.0–0.7)
Eosinophils Relative: 1 % (ref 0.0–5.0)
HCT: 37.9 % (ref 36.0–46.0)
Hemoglobin: 13.2 g/dL (ref 12.0–15.0)
Lymphocytes Relative: 35.1 % (ref 12.0–46.0)
Lymphs Abs: 1.8 10*3/uL (ref 0.7–4.0)
MCHC: 34.8 g/dL (ref 30.0–36.0)
MCV: 89 fl (ref 78.0–100.0)
Monocytes Absolute: 0.4 10*3/uL (ref 0.1–1.0)
Monocytes Relative: 8 % (ref 3.0–12.0)
Neutro Abs: 2.8 10*3/uL (ref 1.4–7.7)
Neutrophils Relative %: 55.1 % (ref 43.0–77.0)
Platelets: 261 10*3/uL (ref 150.0–400.0)
RBC: 4.26 Mil/uL (ref 3.87–5.11)
RDW: 13.3 % (ref 11.5–15.5)
WBC: 5.1 10*3/uL (ref 4.0–10.5)

## 2022-05-13 LAB — T4, FREE: Free T4: 1.01 ng/dL (ref 0.60–1.60)

## 2022-05-13 LAB — TSH: TSH: 1.16 u[IU]/mL (ref 0.35–5.50)

## 2022-05-13 LAB — VITAMIN B12: Vitamin B-12: 875 pg/mL (ref 211–911)

## 2022-05-13 MED ORDER — POTASSIUM CHLORIDE CRYS ER 10 MEQ PO TBCR: 10.0000 meq | EXTENDED_RELEASE_TABLET | Freq: Two times a day (BID) | ORAL | 1 refills | Status: DC

## 2022-05-13 NOTE — Assessment & Plan Note (Addendum)
Check sodium On fluid restriction due to low Na

## 2022-05-13 NOTE — Assessment & Plan Note (Signed)
Using hearing aids

## 2022-05-13 NOTE — Assessment & Plan Note (Signed)
On Vit D 

## 2022-05-13 NOTE — Assessment & Plan Note (Signed)
Chronic No falls

## 2022-05-13 NOTE — Assessment & Plan Note (Signed)
New sleepiness - ?etiology Check TSH, B12, CBC, CMET, UA

## 2022-05-13 NOTE — Assessment & Plan Note (Signed)
Check labs On fluid restriction due to low Na

## 2022-05-13 NOTE — Progress Notes (Signed)
Subjective:  Patient ID: Jessica Rowland, female    DOB: 07/09/1932  Age: 88 y.o. MRN: [id number]  CC: Follow-up (6 month f/u)   HPI Jessica Rowland presents for hearing loss, h/o falls. C/o sleepiness, not falling asleep, fatigue x3 months.  Outpatient Medications Prior to Visit  Medication Sig Dispense Refill   Cholecalciferol (VITAMIN D3) 2000 units capsule Take 1 capsule (2,000 Units total) by mouth daily. 100 capsule 3   diphenhydrAMINE (BENADRYL ALLERGY) 25 MG tablet Take 0.5-1 tablets (12.5-25 mg total) by mouth at bedtime as needed for sleep. 60 tablet 3   Ginkgo Biloba 120 MG CAPS Take 120 mg by mouth 3 (three) times daily.     lipase/protease/amylase (CREON) 36000 UNITS CPEP capsule Take 1 capsule (36,000 Units total) by mouth 3 (three) times daily before meals. Take with the first bite of food 180 capsule 3   ondansetron (ZOFRAN) 4 MG tablet Take 1 tablet (4 mg total) by mouth every 8 (eight) hours as needed for nausea or vomiting. 20 tablet 0   pantoprazole (PROTONIX) 40 MG tablet Take 1 tablet (40 mg total) by mouth daily. 90 tablet 1   sennosides-docusate sodium (SENOKOT-S) 8.6-50 MG tablet Take 1 tablet by mouth daily. 90 tablet 1   No facility-administered medications prior to visit.    ROS: Review of Systems  Constitutional:  Positive for fatigue. Negative for activity change, appetite change, chills and unexpected weight change.  HENT:  Positive for hearing loss. Negative for congestion, mouth sores and sinus pressure.   Eyes:  Negative for visual disturbance.  Respiratory:  Negative for cough, chest tightness, shortness of breath and wheezing.   Cardiovascular:  Negative for chest pain and leg swelling.  Gastrointestinal:  Negative for abdominal pain and nausea.  Genitourinary:  Negative for difficulty urinating, frequency and vaginal pain.  Musculoskeletal:  Negative for back pain and gait problem.  Skin:  Negative for pallor and rash.  Neurological:  Negative  for dizziness, tremors, weakness, numbness and headaches.  Psychiatric/Behavioral:  Positive for sleep disturbance. Negative for confusion and suicidal ideas. The patient is not nervous/anxious.   Hearing loss  Objective:  BP (!) 152/78 (BP Location: Left Arm)   Pulse 91   Temp 97.9 F (36.6 C) (Oral)   Ht 5' (1.524 m)   Wt 94 lb 6.4 oz (42.8 kg)   SpO2 97%   BMI 18.44 kg/m   BP Readings from Last 3 Encounters:  05/13/22 (!) 152/78  10/10/21 (!) 156/60  06/20/21 (!) 152/58    Wt Readings from Last 3 Encounters:  05/13/22 94 lb 6.4 oz (42.8 kg)  10/10/21 100 lb 6.4 oz (45.5 kg)  05/17/21 97 lb 3.2 oz (44.1 kg)    Physical Exam Constitutional:      General: She is not in acute distress.    Appearance: Normal appearance. She is well-developed.  HENT:     Head: Normocephalic.     Right Ear: External ear normal.     Left Ear: External ear normal.     Nose: Nose normal. No congestion.  Eyes:     General:        Right eye: No discharge.        Left eye: No discharge.     Conjunctiva/sclera: Conjunctivae normal.     Pupils: Pupils are equal, round, and reactive to light.  Neck:     Thyroid: No thyromegaly.     Vascular: No JVD.     Trachea: No tracheal deviation.  Cardiovascular:     Rate and Rhythm: Normal rate and regular rhythm.     Heart sounds: Normal heart sounds.  Pulmonary:     Effort: No respiratory distress.     Breath sounds: No stridor. No wheezing.  Abdominal:     General: Bowel sounds are normal. There is no distension.     Palpations: Abdomen is soft. There is no mass.     Tenderness: There is no abdominal tenderness. There is no guarding or rebound.  Musculoskeletal:        General: No tenderness.     Cervical back: Normal range of motion and neck supple. No rigidity.  Lymphadenopathy:     Cervical: No cervical adenopathy.  Skin:    Findings: No erythema or rash.  Neurological:     Mental Status: She is oriented to person, place, and time.      Cranial Nerves: No cranial nerve deficit.     Motor: No abnormal muscle tone.     Coordination: Coordination normal.     Gait: Gait normal.     Deep Tendon Reflexes: Reflexes normal.  Psychiatric:        Behavior: Behavior normal.        Thought Content: Thought content normal.        Judgment: Judgment normal.     Lab Results  Component Value Date   WBC 6.3 10/10/2021   HGB 13.2 10/10/2021   HCT 39.4 10/10/2021   PLT 241.0 10/10/2021   GLUCOSE 91 10/10/2021   CHOL 259 (H) 03/16/2021   TRIG 203.0 (H) 03/16/2021   HDL 72.50 03/16/2021   LDLDIRECT 158.0 03/16/2021   ALT 12 10/10/2021   AST 15 10/10/2021   NA 131 (L) 10/10/2021   K 4.0 10/10/2021   CL 97 10/10/2021   CREATININE 0.51 10/10/2021   BUN 13 10/10/2021   CO2 27 10/10/2021   TSH 1.03 03/16/2021    CT Abdomen Pelvis W Contrast  Result Date: 06/14/2021 CLINICAL DATA:  Right upper quadrant abdominal pain. EXAM: CT ABDOMEN AND PELVIS WITH CONTRAST TECHNIQUE: Multidetector CT imaging of the abdomen and pelvis was performed using the standard protocol following bolus administration of intravenous contrast. CONTRAST:  56m ISOVUE-300 IOPAMIDOL (ISOVUE-300) INJECTION 61% COMPARISON:  None. FINDINGS: Lower chest: No acute abnormality. Hepatobiliary: No focal liver abnormality is seen. No gallstones, gallbladder wall thickening, or biliary dilatation. Pancreas: Unremarkable. No pancreatic ductal dilatation or surrounding inflammatory changes. Spleen: Normal in size without focal abnormality. Adrenals/Urinary Tract: Adrenal glands appear normal. 1.7 cm enhancing partially exophytic mass is seen arising anteriorly from lower pole of right kidney concerning for renal cell carcinoma. No hydronephrosis or renal obstruction is noted. Urinary bladder is unremarkable. Stomach/Bowel: Stomach is within normal limits. Appendix appears normal. No evidence of bowel wall thickening, distention, or inflammatory changes. Vascular/Lymphatic: Aortic  atherosclerosis. No enlarged abdominal or pelvic lymph nodes. Reproductive: Status post hysterectomy. No adnexal masses. Other: No abdominal wall hernia or abnormality. No abdominopelvic ascites. Musculoskeletal: Multilevel degenerative disc disease is noted in lower lumbar spine. No acute osseous abnormality is noted. IMPRESSION: 1.7 cm enhancing mass is seen arising anteriorly from right kidney concerning for renal cell carcinoma. Consultation with urology is recommended. These results will be called to the ordering clinician or representative by the Radiologist Assistant, and communication documented in the PACS or zVision Dashboard. Multilevel degenerative disc disease is noted in the lower lumbar spine. Aortic Atherosclerosis (ICD10-I70.0). Electronically Signed   By: JMarijo ConceptionM.D.   On:  06/14/2021 16:27    Assessment & Plan:   Problem List Items Addressed This Visit     Fatigue - Primary    New sleepiness - ?etiology Check TSH, B12, CBC, CMET, UA      Relevant Orders   Comprehensive metabolic panel   T4, free   TSH   Urinalysis   Vitamin B12   CBC with Differential/Platelet   Gait disorder    Chronic No falls      Relevant Orders   Comprehensive metabolic panel   T4, free   TSH   Urinalysis   Vitamin B12   CBC with Differential/Platelet   Hearing loss    Using hearing aids      Hyponatremia    Check sodium On fluid restriction due to low Na      Osteoporosis    On Vit D      Polydipsia    Check labs On fluid restriction due to low Na      Relevant Orders   Comprehensive metabolic panel   T4, free   TSH   Urinalysis   Vitamin B12   CBC with Differential/Platelet   Renal mass, right    Seeing Dr Alinda Money      Other Visit Diagnoses     Paresthesia       Relevant Orders   T4, free   TSH   Vitamin B12         No orders of the defined types were placed in this encounter.     Follow-up: Return in about 2 weeks (around 05/27/2022) for a  follow-up visit.  Walker Kehr, MD

## 2022-05-13 NOTE — Assessment & Plan Note (Signed)
Seeing Dr Alinda Money

## 2022-05-14 ENCOUNTER — Encounter: Payer: Self-pay | Admitting: Internal Medicine

## 2022-05-18 DIAGNOSIS — Z23 Encounter for immunization: Secondary | ICD-10-CM | POA: Diagnosis not present

## 2022-05-22 ENCOUNTER — Encounter (INDEPENDENT_AMBULATORY_CARE_PROVIDER_SITE_OTHER): Payer: Medicare Other | Admitting: Ophthalmology

## 2022-05-22 DIAGNOSIS — H43813 Vitreous degeneration, bilateral: Secondary | ICD-10-CM | POA: Diagnosis not present

## 2022-05-22 DIAGNOSIS — H353132 Nonexudative age-related macular degeneration, bilateral, intermediate dry stage: Secondary | ICD-10-CM

## 2022-05-22 DIAGNOSIS — H59033 Cystoid macular edema following cataract surgery, bilateral: Secondary | ICD-10-CM | POA: Diagnosis not present

## 2022-06-26 DIAGNOSIS — H40023 Open angle with borderline findings, high risk, bilateral: Secondary | ICD-10-CM | POA: Diagnosis not present

## 2022-06-26 LAB — HM DIABETES EYE EXAM

## 2022-06-27 ENCOUNTER — Encounter: Payer: Self-pay | Admitting: Internal Medicine

## 2022-07-17 ENCOUNTER — Encounter (INDEPENDENT_AMBULATORY_CARE_PROVIDER_SITE_OTHER): Payer: Medicare Other | Admitting: Ophthalmology

## 2022-07-17 DIAGNOSIS — H353132 Nonexudative age-related macular degeneration, bilateral, intermediate dry stage: Secondary | ICD-10-CM

## 2022-07-17 DIAGNOSIS — H43813 Vitreous degeneration, bilateral: Secondary | ICD-10-CM | POA: Diagnosis not present

## 2022-07-17 DIAGNOSIS — H59033 Cystoid macular edema following cataract surgery, bilateral: Secondary | ICD-10-CM

## 2022-07-23 DIAGNOSIS — H2589 Other age-related cataract: Secondary | ICD-10-CM | POA: Diagnosis not present

## 2022-07-23 DIAGNOSIS — H40023 Open angle with borderline findings, high risk, bilateral: Secondary | ICD-10-CM | POA: Diagnosis not present

## 2022-07-23 LAB — HM DIABETES EYE EXAM

## 2022-07-24 ENCOUNTER — Encounter: Payer: Self-pay | Admitting: Internal Medicine

## 2022-08-04 IMAGING — DX DG THORACIC SPINE 2V
2 series · 2 of 2 positions shown · non-contrast
Comparison: None.

CLINICAL DATA: Chronic back pain

EXAM:
THORACIC SPINE 2 VIEWS

[t-spine ap]
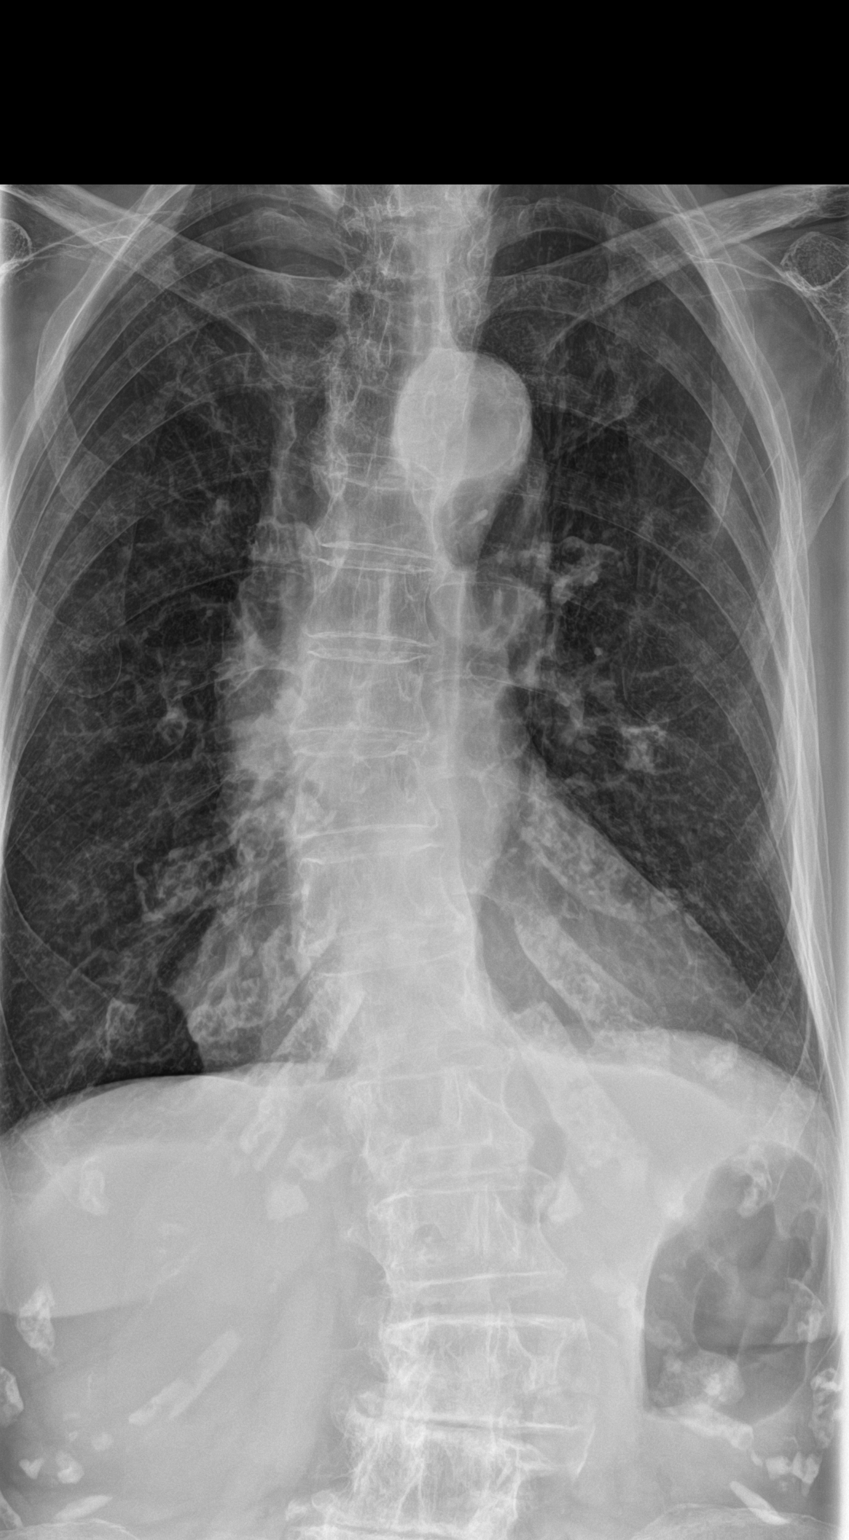

[t-spine lat]
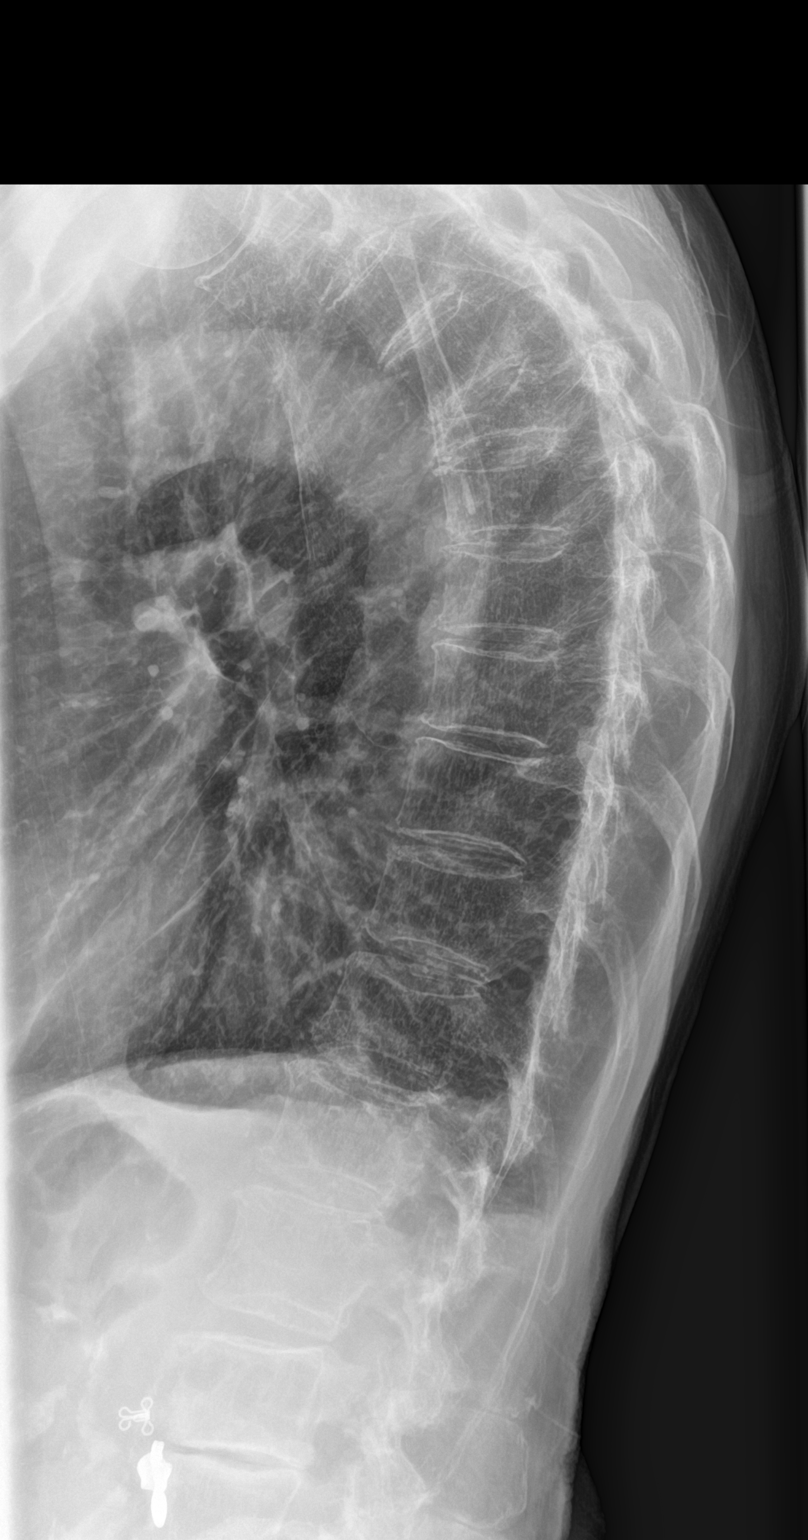

[2 of 2 positions shown; findings below may reference images not displayed]

FINDINGS: Levoscoliosis of the lumbar spine with compensatory dextrocurvature
of the thoracic spine. Decreased osseous mineralization.
Age-indeterminate compression fracture of an upper thoracic
vertebral body, possibly T5, is new since 01/22/2019 chest
radiograph. Multilevel degenerative changes are present.
IMPRESSION: Age-indeterminate compression fracture of an upper thoracic
vertebral body, new since 01/22/2019 chest radiograph. Multilevel
degenerative changes.

## 2022-09-23 ENCOUNTER — Encounter (INDEPENDENT_AMBULATORY_CARE_PROVIDER_SITE_OTHER): Payer: Medicare Other | Admitting: Ophthalmology

## 2022-09-23 DIAGNOSIS — H353132 Nonexudative age-related macular degeneration, bilateral, intermediate dry stage: Secondary | ICD-10-CM

## 2022-09-23 DIAGNOSIS — H43813 Vitreous degeneration, bilateral: Secondary | ICD-10-CM

## 2022-09-23 DIAGNOSIS — H59033 Cystoid macular edema following cataract surgery, bilateral: Secondary | ICD-10-CM

## 2022-10-10 DIAGNOSIS — H40053 Ocular hypertension, bilateral: Secondary | ICD-10-CM | POA: Diagnosis not present

## 2022-10-10 DIAGNOSIS — H40051 Ocular hypertension, right eye: Secondary | ICD-10-CM | POA: Diagnosis not present

## 2022-10-10 DIAGNOSIS — H40023 Open angle with borderline findings, high risk, bilateral: Secondary | ICD-10-CM | POA: Diagnosis not present

## 2022-10-24 DIAGNOSIS — H40053 Ocular hypertension, bilateral: Secondary | ICD-10-CM | POA: Diagnosis not present

## 2022-10-24 DIAGNOSIS — H40023 Open angle with borderline findings, high risk, bilateral: Secondary | ICD-10-CM | POA: Diagnosis not present

## 2022-10-24 LAB — HM DIABETES EYE EXAM

## 2022-10-25 ENCOUNTER — Encounter: Payer: Self-pay | Admitting: Internal Medicine

## 2022-11-11 ENCOUNTER — Encounter: Payer: Self-pay | Admitting: Internal Medicine

## 2022-11-11 ENCOUNTER — Ambulatory Visit (INDEPENDENT_AMBULATORY_CARE_PROVIDER_SITE_OTHER): Payer: Medicare Other | Admitting: Internal Medicine

## 2022-11-11 VITALS — BP 120/64 | HR 75 | Temp 98.2°F | Ht 60.0 in | Wt 89.0 lb

## 2022-11-11 DIAGNOSIS — R269 Unspecified abnormalities of gait and mobility: Secondary | ICD-10-CM

## 2022-11-11 DIAGNOSIS — M810 Age-related osteoporosis without current pathological fracture: Secondary | ICD-10-CM

## 2022-11-11 DIAGNOSIS — R634 Abnormal weight loss: Secondary | ICD-10-CM | POA: Diagnosis not present

## 2022-11-11 DIAGNOSIS — K219 Gastro-esophageal reflux disease without esophagitis: Secondary | ICD-10-CM

## 2022-11-11 DIAGNOSIS — N2889 Other specified disorders of kidney and ureter: Secondary | ICD-10-CM | POA: Diagnosis not present

## 2022-11-11 DIAGNOSIS — E871 Hypo-osmolality and hyponatremia: Secondary | ICD-10-CM | POA: Diagnosis not present

## 2022-11-11 DIAGNOSIS — H547 Unspecified visual loss: Secondary | ICD-10-CM

## 2022-11-11 LAB — URINALYSIS, ROUTINE W REFLEX MICROSCOPIC
Bilirubin Urine: NEGATIVE
Hgb urine dipstick: NEGATIVE
Ketones, ur: NEGATIVE
Nitrite: NEGATIVE
RBC / HPF: NONE SEEN (ref 0–?)
Specific Gravity, Urine: 1.01 (ref 1.000–1.030)
Total Protein, Urine: NEGATIVE
Urine Glucose: NEGATIVE
Urobilinogen, UA: 0.2 (ref 0.0–1.0)
pH: 6 (ref 5.0–8.0)

## 2022-11-11 LAB — CBC WITH DIFFERENTIAL/PLATELET
Basophils Absolute: 0 10*3/uL (ref 0.0–0.1)
Basophils Relative: 0.6 % (ref 0.0–3.0)
Eosinophils Absolute: 0.1 10*3/uL (ref 0.0–0.7)
Eosinophils Relative: 0.8 % (ref 0.0–5.0)
HCT: 39.8 % (ref 36.0–46.0)
Hemoglobin: 13.6 g/dL (ref 12.0–15.0)
Lymphocytes Relative: 24.2 % (ref 12.0–46.0)
Lymphs Abs: 1.7 10*3/uL (ref 0.7–4.0)
MCHC: 34.2 g/dL (ref 30.0–36.0)
MCV: 90.7 fl (ref 78.0–100.0)
Monocytes Absolute: 0.5 10*3/uL (ref 0.1–1.0)
Monocytes Relative: 7.2 % (ref 3.0–12.0)
Neutro Abs: 4.8 10*3/uL (ref 1.4–7.7)
Neutrophils Relative %: 67.2 % (ref 43.0–77.0)
Platelets: 264 10*3/uL (ref 150.0–400.0)
RBC: 4.39 Mil/uL (ref 3.87–5.11)
RDW: 13.8 % (ref 11.5–15.5)
WBC: 7.2 10*3/uL (ref 4.0–10.5)

## 2022-11-11 LAB — COMPREHENSIVE METABOLIC PANEL
ALT: 14 U/L (ref 0–35)
AST: 15 U/L (ref 0–37)
Albumin: 4 g/dL (ref 3.5–5.2)
Alkaline Phosphatase: 51 U/L (ref 39–117)
BUN: 12 mg/dL (ref 6–23)
CO2: 27 mEq/L (ref 19–32)
Calcium: 8.8 mg/dL (ref 8.4–10.5)
Chloride: 97 mEq/L (ref 96–112)
Creatinine, Ser: 0.51 mg/dL (ref 0.40–1.20)
GFR: 83.42 mL/min (ref >60.00)
Glucose, Bld: 92 mg/dL (ref 70–99)
Potassium: 4 mEq/L (ref 3.5–5.1)
Sodium: 128 mEq/L — ABNORMAL LOW (ref 135–145)
Total Bilirubin: 0.6 mg/dL (ref 0.2–1.2)
Total Protein: 6.3 g/dL (ref 6.0–8.3)

## 2022-11-11 LAB — TSH: TSH: 1.15 u[IU]/mL (ref 0.35–5.50)

## 2022-11-11 MED ORDER — PANTOPRAZOLE SODIUM 40 MG PO TBEC: 40.0000 mg | DELAYED_RELEASE_TABLET | Freq: Every day | ORAL | 1 refills | Status: DC

## 2022-11-11 NOTE — Assessment & Plan Note (Signed)
On Vit D Use TUMS

## 2022-11-11 NOTE — Assessment & Plan Note (Signed)
Eat better Check labs, TSH

## 2022-11-11 NOTE — Assessment & Plan Note (Addendum)
Worse Pt wants to re-start Pantoprazole

## 2022-11-11 NOTE — Progress Notes (Unsigned)
Subjective:  Patient ID: Jessica Rowland, female    DOB: Feb 08, 1932  Age: 89 y.o. MRN: [id number]  CC: Abdominal Pain   HPI Jessica Rowland presents for MD/glaucoma F/u on low sodium, constipation, renal mass C/o heartburn again   Outpatient Medications Prior to Visit  Medication Sig Dispense Refill   brimonidine-timolol (COMBIGAN) 0.2-0.5 % ophthalmic solution Apply to eye.     Cholecalciferol (VITAMIN D3) 2000 units capsule Take 1 capsule (2,000 Units total) by mouth daily. 100 capsule 3   diphenhydrAMINE (BENADRYL ALLERGY) 25 MG tablet Take 0.5-1 tablets (12.5-25 mg total) by mouth at bedtime as needed for sleep. 60 tablet 3   Ginkgo Biloba 120 MG CAPS Take 120 mg by mouth 3 (three) times daily.     ketorolac (ACULAR) 0.5 % ophthalmic solution      latanoprost (XALATAN) 0.005 % ophthalmic solution 1 drop at bedtime.     lipase/protease/amylase (CREON) 36000 UNITS CPEP capsule Take 1 capsule (36,000 Units total) by mouth 3 (three) times daily before meals. Take with the first bite of food 180 capsule 3   Omega-3 Fatty Acids (FISH OIL) 1000 MG CAPS Take by mouth.     potassium chloride (KLOR-CON M) 10 MEQ tablet Take 1 tablet (10 mEq total) by mouth 2 (two) times daily. 60 tablet 1   prednisoLONE acetate (PRED FORTE) 1 % ophthalmic suspension      sennosides-docusate sodium (SENOKOT-S) 8.6-50 MG tablet Take 1 tablet by mouth daily. 90 tablet 1   pantoprazole (PROTONIX) 40 MG tablet Take 1 tablet (40 mg total) by mouth daily. 90 tablet 1   ondansetron (ZOFRAN) 4 MG tablet Take 1 tablet (4 mg total) by mouth every 8 (eight) hours as needed for nausea or vomiting. 20 tablet 0   No facility-administered medications prior to visit.    ROS: Review of Systems  Constitutional:  Negative for activity change, appetite change, chills, fatigue and unexpected weight change.  HENT:  Negative for congestion, mouth sores and sinus pressure.   Eyes:  Positive for visual disturbance.   Respiratory:  Negative for cough and chest tightness.   Gastrointestinal:  Negative for abdominal pain and nausea.  Genitourinary:  Negative for difficulty urinating, frequency and vaginal pain.  Musculoskeletal:  Negative for back pain and gait problem.  Skin:  Negative for pallor and rash.  Neurological:  Negative for dizziness, tremors, weakness, numbness and headaches.  Psychiatric/Behavioral:  Negative for confusion and sleep disturbance.     Objective:  BP 120/64 (BP Location: Left Arm, Patient Position: Sitting, Cuff Size: Small)   Pulse 75   Temp 98.2 F (36.8 C) (Oral)   Ht 5' (1.524 m)   Wt 89 lb (40.4 kg)   SpO2 99%   BMI 17.38 kg/m   BP Readings from Last 3 Encounters:  11/11/22 120/64  05/13/22 (!) 152/78  10/10/21 (!) 156/60    Wt Readings from Last 3 Encounters:  11/11/22 89 lb (40.4 kg)  05/13/22 94 lb 6.4 oz (42.8 kg)  10/10/21 100 lb 6.4 oz (45.5 kg)    Physical Exam Constitutional:      General: She is not in acute distress.    Appearance: She is well-developed.  HENT:     Head: Normocephalic.     Right Ear: External ear normal.     Left Ear: External ear normal.     Nose: Nose normal.  Eyes:     General:        Right eye: No discharge.  Left eye: No discharge.     Conjunctiva/sclera: Conjunctivae normal.     Pupils: Pupils are equal, round, and reactive to light.  Neck:     Thyroid: No thyromegaly.     Vascular: No JVD.     Trachea: No tracheal deviation.  Cardiovascular:     Rate and Rhythm: Normal rate and regular rhythm.     Heart sounds: Normal heart sounds.  Pulmonary:     Effort: No respiratory distress.     Breath sounds: No stridor. No wheezing or rhonchi.  Abdominal:     General: Bowel sounds are normal. There is no distension.     Palpations: Abdomen is soft. There is no mass.     Tenderness: There is no abdominal tenderness. There is no guarding or rebound.  Musculoskeletal:        General: No tenderness.      Cervical back: Normal range of motion and neck supple. No rigidity.  Lymphadenopathy:     Cervical: No cervical adenopathy.  Skin:    Findings: No erythema or rash.  Neurological:     Mental Status: She is oriented to person, place, and time.     Cranial Nerves: No cranial nerve deficit.     Motor: No abnormal muscle tone.     Coordination: Coordination normal.     Deep Tendon Reflexes: Reflexes normal.  Psychiatric:        Behavior: Behavior normal.        Thought Content: Thought content normal.        Judgment: Judgment normal.   Thin, slightly ataxic    A total time of 45 minutes was spent preparing to see the patient, reviewing tests, x-rays, operative reports and other medical records.  Also, obtaining history and performing comprehensive physical exam.  Additionally, counseling the patient regarding the above listed issues -unsteady gait, low sodium, GERD.   Finally, documenting clinical information in the health records, coordination of care, educating the patient.   Lab Results  Component Value Date   WBC 7.2 11/11/2022   HGB 13.6 11/11/2022   HCT 39.8 11/11/2022   PLT 264.0 11/11/2022   GLUCOSE 92 11/11/2022   CHOL 259 (H) 03/16/2021   TRIG 203.0 (H) 03/16/2021   HDL 72.50 03/16/2021   LDLDIRECT 158.0 03/16/2021   ALT 14 11/11/2022   AST 15 11/11/2022   NA 128 (L) 11/11/2022   K 4.0 11/11/2022   CL 97 11/11/2022   CREATININE 0.51 11/11/2022   BUN 12 11/11/2022   CO2 27 11/11/2022   TSH 1.15 11/11/2022    CT Abdomen Pelvis W Contrast  Result Date: 06/14/2021 CLINICAL DATA:  Right upper quadrant abdominal pain. EXAM: CT ABDOMEN AND PELVIS WITH CONTRAST TECHNIQUE: Multidetector CT imaging of the abdomen and pelvis was performed using the standard protocol following bolus administration of intravenous contrast. CONTRAST:  46mL ISOVUE-300 IOPAMIDOL (ISOVUE-300) INJECTION 61% COMPARISON:  None. FINDINGS: Lower chest: No acute abnormality. Hepatobiliary: No focal liver  abnormality is seen. No gallstones, gallbladder wall thickening, or biliary dilatation. Pancreas: Unremarkable. No pancreatic ductal dilatation or surrounding inflammatory changes. Spleen: Normal in size without focal abnormality. Adrenals/Urinary Tract: Adrenal glands appear normal. 1.7 cm enhancing partially exophytic mass is seen arising anteriorly from lower pole of right kidney concerning for renal cell carcinoma. No hydronephrosis or renal obstruction is noted. Urinary bladder is unremarkable. Stomach/Bowel: Stomach is within normal limits. Appendix appears normal. No evidence of bowel wall thickening, distention, or inflammatory changes. Vascular/Lymphatic: Aortic atherosclerosis. No enlarged  abdominal or pelvic lymph nodes. Reproductive: Status post hysterectomy. No adnexal masses. Other: No abdominal wall hernia or abnormality. No abdominopelvic ascites. Musculoskeletal: Multilevel degenerative disc disease is noted in lower lumbar spine. No acute osseous abnormality is noted. IMPRESSION: 1.7 cm enhancing mass is seen arising anteriorly from right kidney concerning for renal cell carcinoma. Consultation with urology is recommended. These results will be called to the ordering clinician or representative by the Radiologist Assistant, and communication documented in the PACS or zVision Dashboard. Multilevel degenerative disc disease is noted in the lower lumbar spine. Aortic Atherosclerosis (ICD10-I70.0). Electronically Signed   By: Marijo Conception M.D.   On: 06/14/2021 16:27    Assessment & Plan:   Problem List Items Addressed This Visit       Digestive   GERD (gastroesophageal reflux disease)    Worse Pt wants to re-start Pantoprazole       Relevant Medications   pantoprazole (PROTONIX) 40 MG tablet   Other Relevant Orders   TSH (Completed)     Musculoskeletal and Integument   Osteoporosis    On Vit D Use TUMS        Other   Weight loss    Eat better Check labs, TSH       Relevant Orders   TSH (Completed)   Comprehensive metabolic panel (Completed)   Vision problems    MD, glaucoma      Renal mass, right    F/u w/Dr Alinda Money CT - 1.7 cm enhancing mass is seen arising anteriorly from right kidney  CT - pending      Hyponatremia - Primary    Unclear etiology fluid restriction Re-check c-Met May need to obtain urine osmolality and sodium      Relevant Orders   TSH (Completed)   Urinalysis   CBC with Differential/Platelet (Completed)   Comprehensive metabolic panel (Completed)   Osmolality, urine   Sodium, urine, random   Comprehensive metabolic panel   Gait disorder    Worse.   Use Hurrycane cane Hyponatremia may be contributing.  Treat hyponatremia         Meds ordered this encounter  Medications   pantoprazole (PROTONIX) 40 MG tablet    Sig: Take 1 tablet (40 mg total) by mouth daily.    Dispense:  90 tablet    Refill:  1      Follow-up: Return in about 3 months (around 02/10/2023) for a follow-up visit.  Walker Kehr, MD

## 2022-11-11 NOTE — Patient Instructions (Addendum)
Take Vit D Use TUMS Blue-Emu cream use 2-3 times a day   Hurrycane  -- ie Medline Offset Folding Cane, 4-Point Base With Cushioned Gel Handle

## 2022-11-11 NOTE — Assessment & Plan Note (Signed)
Unclear etiology fluid restriction Re-check c-Met May need to obtain urine osmolality and sodium

## 2022-11-11 NOTE — Assessment & Plan Note (Signed)
MD, glaucoma

## 2022-11-11 NOTE — Assessment & Plan Note (Signed)
F/u w/Dr Alinda Money CT - 1.7 cm enhancing mass is seen arising anteriorly from right kidney  CT - pending

## 2022-11-12 ENCOUNTER — Encounter: Payer: Self-pay | Admitting: Internal Medicine

## 2022-11-12 ENCOUNTER — Other Ambulatory Visit: Payer: Self-pay | Admitting: Internal Medicine

## 2022-11-12 MED ORDER — CEPHALEXIN 500 MG PO CAPS: 500.0000 mg | ORAL_CAPSULE | Freq: Three times a day (TID) | ORAL | 0 refills | Status: DC

## 2022-11-12 NOTE — Assessment & Plan Note (Signed)
Worse.   Use Hurrycane cane Hyponatremia may be contributing.  Treat hyponatremia

## 2022-11-25 ENCOUNTER — Encounter (INDEPENDENT_AMBULATORY_CARE_PROVIDER_SITE_OTHER): Payer: Medicare Other | Admitting: Ophthalmology

## 2022-11-25 ENCOUNTER — Other Ambulatory Visit (INDEPENDENT_AMBULATORY_CARE_PROVIDER_SITE_OTHER): Payer: Medicare Other

## 2022-11-25 DIAGNOSIS — E871 Hypo-osmolality and hyponatremia: Secondary | ICD-10-CM | POA: Diagnosis not present

## 2022-11-25 DIAGNOSIS — H353132 Nonexudative age-related macular degeneration, bilateral, intermediate dry stage: Secondary | ICD-10-CM

## 2022-11-25 DIAGNOSIS — H43813 Vitreous degeneration, bilateral: Secondary | ICD-10-CM | POA: Diagnosis not present

## 2022-11-25 DIAGNOSIS — H59033 Cystoid macular edema following cataract surgery, bilateral: Secondary | ICD-10-CM

## 2022-11-25 LAB — COMPREHENSIVE METABOLIC PANEL
ALT: 14 U/L (ref 0–35)
AST: 15 U/L (ref 0–37)
Albumin: 4.3 g/dL (ref 3.5–5.2)
Alkaline Phosphatase: 48 U/L (ref 39–117)
BUN: 10 mg/dL (ref 6–23)
CO2: 27 mEq/L (ref 19–32)
Calcium: 9.1 mg/dL (ref 8.4–10.5)
Chloride: 95 mEq/L — ABNORMAL LOW (ref 96–112)
Creatinine, Ser: 0.48 mg/dL (ref 0.40–1.20)
GFR: 84.63 mL/min (ref >60.00)
Glucose, Bld: 99 mg/dL (ref 70–99)
Potassium: 4.2 mEq/L (ref 3.5–5.1)
Sodium: 128 mEq/L — ABNORMAL LOW (ref 135–145)
Total Bilirubin: 0.6 mg/dL (ref 0.2–1.2)
Total Protein: 6.8 g/dL (ref 6.0–8.3)

## 2022-11-27 LAB — OSMOLALITY, URINE: Osmolality, Ur: 238 mOsm/kg (ref 50–1200)

## 2022-11-27 LAB — SODIUM, URINE, RANDOM: Sodium, Ur: 64 mmol/L (ref 28–272)

## 2023-01-30 DIAGNOSIS — H40053 Ocular hypertension, bilateral: Secondary | ICD-10-CM | POA: Diagnosis not present

## 2023-01-30 DIAGNOSIS — H40023 Open angle with borderline findings, high risk, bilateral: Secondary | ICD-10-CM | POA: Diagnosis not present

## 2023-02-03 ENCOUNTER — Encounter (INDEPENDENT_AMBULATORY_CARE_PROVIDER_SITE_OTHER): Payer: Medicare Other | Admitting: Ophthalmology

## 2023-02-03 DIAGNOSIS — H59033 Cystoid macular edema following cataract surgery, bilateral: Secondary | ICD-10-CM | POA: Diagnosis not present

## 2023-02-03 DIAGNOSIS — H43813 Vitreous degeneration, bilateral: Secondary | ICD-10-CM

## 2023-02-03 DIAGNOSIS — H353132 Nonexudative age-related macular degeneration, bilateral, intermediate dry stage: Secondary | ICD-10-CM

## 2023-03-10 ENCOUNTER — Encounter: Payer: Self-pay | Admitting: Internal Medicine

## 2023-03-10 ENCOUNTER — Ambulatory Visit (INDEPENDENT_AMBULATORY_CARE_PROVIDER_SITE_OTHER): Payer: Medicare Other | Admitting: Internal Medicine

## 2023-03-10 ENCOUNTER — Ambulatory Visit: Payer: Medicare Other

## 2023-03-10 VITALS — BP 144/76 | HR 75 | Temp 97.6°F | Ht 60.0 in | Wt 91.5 lb

## 2023-03-10 DIAGNOSIS — R1011 Right upper quadrant pain: Secondary | ICD-10-CM | POA: Diagnosis not present

## 2023-03-10 DIAGNOSIS — K8020 Calculus of gallbladder without cholecystitis without obstruction: Secondary | ICD-10-CM | POA: Diagnosis not present

## 2023-03-10 DIAGNOSIS — C641 Malignant neoplasm of right kidney, except renal pelvis: Secondary | ICD-10-CM | POA: Diagnosis not present

## 2023-03-10 DIAGNOSIS — K219 Gastro-esophageal reflux disease without esophagitis: Secondary | ICD-10-CM | POA: Diagnosis not present

## 2023-03-10 LAB — CBC WITH DIFFERENTIAL/PLATELET
Basophils Absolute: 0.1 10*3/uL (ref 0.0–0.1)
Basophils Relative: 0.7 % (ref 0.0–3.0)
Eosinophils Absolute: 0.1 10*3/uL (ref 0.0–0.7)
Eosinophils Relative: 0.7 % (ref 0.0–5.0)
HCT: 41.3 % (ref 36.0–46.0)
Hemoglobin: 13.7 g/dL (ref 12.0–15.0)
Lymphocytes Relative: 24.7 % (ref 12.0–46.0)
Lymphs Abs: 1.8 10*3/uL (ref 0.7–4.0)
MCHC: 33.2 g/dL (ref 30.0–36.0)
MCV: 92.4 fl (ref 78.0–100.0)
Monocytes Absolute: 0.6 10*3/uL (ref 0.1–1.0)
Monocytes Relative: 8.5 % (ref 3.0–12.0)
Neutro Abs: 4.8 10*3/uL (ref 1.4–7.7)
Neutrophils Relative %: 65.4 % (ref 43.0–77.0)
Platelets: 295 10*3/uL (ref 150.0–400.0)
RBC: 4.46 Mil/uL (ref 3.87–5.11)
RDW: 13.8 % (ref 11.5–15.5)
WBC: 7.3 10*3/uL (ref 4.0–10.5)

## 2023-03-10 LAB — URINALYSIS, ROUTINE W REFLEX MICROSCOPIC
Bilirubin Urine: NEGATIVE
Hgb urine dipstick: NEGATIVE
Ketones, ur: NEGATIVE
Nitrite: NEGATIVE
RBC / HPF: NONE SEEN (ref 0–?)
Specific Gravity, Urine: 1.01 (ref 1.000–1.030)
Total Protein, Urine: NEGATIVE
Urine Glucose: NEGATIVE
Urobilinogen, UA: 0.2 (ref 0.0–1.0)
pH: 6.5 (ref 5.0–8.0)

## 2023-03-10 LAB — COMPREHENSIVE METABOLIC PANEL
ALT: 18 U/L (ref 0–35)
AST: 17 U/L (ref 0–37)
Albumin: 4.1 g/dL (ref 3.5–5.2)
Alkaline Phosphatase: 48 U/L (ref 39–117)
BUN: 13 mg/dL (ref 6–23)
CO2: 27 mEq/L (ref 19–32)
Calcium: 9.1 mg/dL (ref 8.4–10.5)
Chloride: 97 mEq/L (ref 96–112)
Creatinine, Ser: 0.53 mg/dL (ref 0.40–1.20)
GFR: 82.46 mL/min (ref >60.00)
Glucose, Bld: 87 mg/dL (ref 70–99)
Potassium: 4.5 mEq/L (ref 3.5–5.1)
Sodium: 130 mEq/L — ABNORMAL LOW (ref 135–145)
Total Bilirubin: 0.6 mg/dL (ref 0.2–1.2)
Total Protein: 6.4 g/dL (ref 6.0–8.3)

## 2023-03-10 MED ORDER — PANTOPRAZOLE SODIUM 40 MG PO TBEC: 40.0000 mg | DELAYED_RELEASE_TABLET | Freq: Every day | ORAL | 1 refills | Status: DC

## 2023-03-10 NOTE — Assessment & Plan Note (Signed)
R side - Dr Laverle Patter CT abd The pt missed CT in June w/Urology

## 2023-03-10 NOTE — Assessment & Plan Note (Signed)
?  etiology H pylori - treated Re-start Pantaprazole

## 2023-03-10 NOTE — Assessment & Plan Note (Signed)
?  etiology Check CMET, CBC, UA Ordered CT abd Re-start Pantaprazole

## 2023-03-10 NOTE — Assessment & Plan Note (Signed)
Abd CT ordered

## 2023-03-10 NOTE — Progress Notes (Signed)
Subjective:  Patient ID: Jessica Rowland, female    DOB: 11/15/1931  Age: 89 y.o. MRN: [id number]  CC: Pain (Right side pain ( around the rib area), been going on for almost a month. Had no fall to cause  the pain, just happened randomly)   HPI Deshae Zemel presents for RUQ pain x 4 wks, worse w/standing The pt missed CT in June w/Urology C/o dark stools, constipation  Outpatient Medications Prior to Visit  Medication Sig Dispense Refill   brimonidine-timolol (COMBIGAN) 0.2-0.5 % ophthalmic solution Apply to eye.     Cholecalciferol (VITAMIN D3) 2000 units capsule Take 1 capsule (2,000 Units total) by mouth daily. 100 capsule 3   ketorolac (ACULAR) 0.5 % ophthalmic solution      lipase/protease/amylase (CREON) 36000 UNITS CPEP capsule Take 1 capsule (36,000 Units total) by mouth 3 (three) times daily before meals. Take with the first bite of food 180 capsule 3   prednisoLONE acetate (PRED FORTE) 1 % ophthalmic suspension      sennosides-docusate sodium (SENOKOT-S) 8.6-50 MG tablet Take 1 tablet by mouth daily. 90 tablet 1   cephALEXin (KEFLEX) 500 MG capsule Take 1 capsule (500 mg total) by mouth 3 (three) times daily. (Patient not taking: Reported on 03/10/2023) 15 capsule 0   diphenhydrAMINE (BENADRYL ALLERGY) 25 MG tablet Take 0.5-1 tablets (12.5-25 mg total) by mouth at bedtime as needed for sleep. (Patient not taking: Reported on 03/10/2023) 60 tablet 3   pantoprazole (PROTONIX) 40 MG tablet Take 1 tablet (40 mg total) by mouth daily. (Patient not taking: Reported on 03/10/2023) 90 tablet 1   potassium chloride (KLOR-CON M) 10 MEQ tablet Take 1 tablet (10 mEq total) by mouth 2 (two) times daily. (Patient not taking: Reported on 03/10/2023) 60 tablet 1   Ginkgo Biloba 120 MG CAPS Take 120 mg by mouth 3 (three) times daily.     latanoprost (XALATAN) 0.005 % ophthalmic solution 1 drop at bedtime.     Omega-3 Fatty Acids (FISH OIL) 1000 MG CAPS Take by mouth.     No  facility-administered medications prior to visit.    ROS: Review of Systems  Constitutional:  Positive for fatigue. Negative for activity change, appetite change, chills and unexpected weight change.  HENT:  Negative for congestion, mouth sores and sinus pressure.   Eyes:  Negative for visual disturbance.  Respiratory:  Negative for cough and chest tightness.   Cardiovascular:  Negative for leg swelling.  Gastrointestinal:  Positive for abdominal pain and constipation. Negative for diarrhea and nausea.  Genitourinary:  Negative for difficulty urinating, frequency and vaginal pain.  Musculoskeletal:  Negative for back pain and gait problem.  Skin:  Negative for pallor and rash.  Neurological:  Negative for dizziness, tremors, weakness, numbness and headaches.  Psychiatric/Behavioral:  Negative for confusion and sleep disturbance. The patient is not nervous/anxious.     Objective:  BP (!) 144/76   Pulse 75   Temp 97.6 F (36.4 C) (Temporal)   Ht 5' (1.524 m)   Wt 91 lb 8 oz (41.5 kg)   SpO2 99%   BMI 17.87 kg/m   BP Readings from Last 3 Encounters:  03/10/23 (!) 144/76  11/11/22 120/64  05/13/22 (!) 152/78    Wt Readings from Last 3 Encounters:  03/10/23 91 lb 8 oz (41.5 kg)  11/11/22 89 lb (40.4 kg)  05/13/22 94 lb 6.4 oz (42.8 kg)    Physical Exam Constitutional:      Appearance: Normal appearance.  Abdominal:  General: There is no distension.     Palpations: There is no mass.     Tenderness: There is abdominal tenderness.     Hernia: No hernia is present.  Skin:    Findings: No lesion.  Neurological:     Mental Status: She is oriented to person, place, and time.     Gait: Gait normal.  Psychiatric:        Mood and Affect: Mood normal.    Pale a little DOE RUQ is sensitive   Lab Results  Component Value Date   WBC 7.2 11/11/2022   HGB 13.6 11/11/2022   HCT 39.8 11/11/2022   PLT 264.0 11/11/2022   GLUCOSE 99 11/25/2022   CHOL 259 (H) 03/16/2021    TRIG 203.0 (H) 03/16/2021   HDL 72.50 03/16/2021   LDLDIRECT 158.0 03/16/2021   ALT 14 11/25/2022   AST 15 11/25/2022   NA 128 (L) 11/25/2022   K 4.2 11/25/2022   CL 95 (L) 11/25/2022   CREATININE 0.48 11/25/2022   BUN 10 11/25/2022   CO2 27 11/25/2022   TSH 1.15 11/11/2022    CT Abdomen Pelvis W Contrast  Result Date: 06/14/2021 CLINICAL DATA:  Right upper quadrant abdominal pain. EXAM: CT ABDOMEN AND PELVIS WITH CONTRAST TECHNIQUE: Multidetector CT imaging of the abdomen and pelvis was performed using the standard protocol following bolus administration of intravenous contrast. CONTRAST:  80mL ISOVUE-300 IOPAMIDOL (ISOVUE-300) INJECTION 61% COMPARISON:  None. FINDINGS: Lower chest: No acute abnormality. Hepatobiliary: No focal liver abnormality is seen. No gallstones, gallbladder wall thickening, or biliary dilatation. Pancreas: Unremarkable. No pancreatic ductal dilatation or surrounding inflammatory changes. Spleen: Normal in size without focal abnormality. Adrenals/Urinary Tract: Adrenal glands appear normal. 1.7 cm enhancing partially exophytic mass is seen arising anteriorly from lower pole of right kidney concerning for renal cell carcinoma. No hydronephrosis or renal obstruction is noted. Urinary bladder is unremarkable. Stomach/Bowel: Stomach is within normal limits. Appendix appears normal. No evidence of bowel wall thickening, distention, or inflammatory changes. Vascular/Lymphatic: Aortic atherosclerosis. No enlarged abdominal or pelvic lymph nodes. Reproductive: Status post hysterectomy. No adnexal masses. Other: No abdominal wall hernia or abnormality. No abdominopelvic ascites. Musculoskeletal: Multilevel degenerative disc disease is noted in lower lumbar spine. No acute osseous abnormality is noted. IMPRESSION: 1.7 cm enhancing mass is seen arising anteriorly from right kidney concerning for renal cell carcinoma. Consultation with urology is recommended. These results will be  called to the ordering clinician or representative by the Radiologist Assistant, and communication documented in the PACS or zVision Dashboard. Multilevel degenerative disc disease is noted in the lower lumbar spine. Aortic Atherosclerosis (ICD10-I70.0). Electronically Signed   By: Lupita Raider M.D.   On: 06/14/2021 16:27    Assessment & Plan:   Problem List Items Addressed This Visit     GERD (gastroesophageal reflux disease)    ?etiology H pylori - treated Re-start Pantaprazole      Abdominal pain, RUQ (right upper quadrant) - Primary    ?etiology Check CMET, CBC, UA Ordered CT abd Re-start Pantaprazole      Renal cancer, right (HCC)    R side - Dr Laverle Patter CT abd The pt missed CT in June w/Urology      Gallstone    Abd CT ordered         No orders of the defined types were placed in this encounter.     Follow-up: No follow-ups on file.  Sonda Primes, MD

## 2023-03-12 ENCOUNTER — Encounter (INDEPENDENT_AMBULATORY_CARE_PROVIDER_SITE_OTHER): Payer: Self-pay

## 2023-03-19 ENCOUNTER — Other Ambulatory Visit: Payer: Medicare Other

## 2023-03-21 ENCOUNTER — Other Ambulatory Visit: Payer: Medicare Other

## 2023-03-21 ENCOUNTER — Ambulatory Visit
Admission: RE | Admit: 2023-03-21 | Discharge: 2023-03-21 | Disposition: A | Discharge status: Home / Self Care | Payer: Medicare Other | Source: Ambulatory Visit | Attending: Internal Medicine | Admitting: Internal Medicine

## 2023-03-21 DIAGNOSIS — K8020 Calculus of gallbladder without cholecystitis without obstruction: Secondary | ICD-10-CM

## 2023-03-21 DIAGNOSIS — R1011 Right upper quadrant pain: Secondary | ICD-10-CM

## 2023-03-21 DIAGNOSIS — C641 Malignant neoplasm of right kidney, except renal pelvis: Secondary | ICD-10-CM

## 2023-03-21 DIAGNOSIS — I700 Atherosclerosis of aorta: Secondary | ICD-10-CM | POA: Diagnosis not present

## 2023-03-21 MED ORDER — IOPAMIDOL (ISOVUE-300) INJECTION 61%
75.0000 mL | Freq: Once | INTRAVENOUS | Status: AC | PRN
  Administered 2023-03-21: 75 mL via INTRAVENOUS

## 2023-03-31 ENCOUNTER — Encounter: Payer: Self-pay | Admitting: Internal Medicine

## 2023-03-31 ENCOUNTER — Ambulatory Visit (INDEPENDENT_AMBULATORY_CARE_PROVIDER_SITE_OTHER): Payer: Medicare Other | Admitting: Internal Medicine

## 2023-03-31 VITALS — BP 138/56 | HR 76 | Temp 97.6°F | Ht 60.0 in | Wt 95.0 lb

## 2023-03-31 DIAGNOSIS — N2889 Other specified disorders of kidney and ureter: Secondary | ICD-10-CM | POA: Diagnosis not present

## 2023-03-31 DIAGNOSIS — K8020 Calculus of gallbladder without cholecystitis without obstruction: Secondary | ICD-10-CM

## 2023-03-31 DIAGNOSIS — C641 Malignant neoplasm of right kidney, except renal pelvis: Secondary | ICD-10-CM

## 2023-03-31 DIAGNOSIS — R109 Unspecified abdominal pain: Secondary | ICD-10-CM

## 2023-03-31 DIAGNOSIS — R5383 Other fatigue: Secondary | ICD-10-CM

## 2023-03-31 MED ORDER — URSODIOL 300 MG PO CAPS: 300.0000 mg | ORAL_CAPSULE | Freq: Two times a day (BID) | ORAL | 5 refills | Status: DC

## 2023-03-31 MED ORDER — HYOSCYAMINE SULFATE 0.125 MG PO TABS: 0.1250 mg | ORAL_TABLET | ORAL | 3 refills | Status: DC | PRN

## 2023-03-31 MED ORDER — POLYETHYLENE GLYCOL 3350 17 GM/SCOOP PO POWD: 17.0000 g | Freq: Two times a day (BID) | ORAL | 5 refills | Status: DC | PRN

## 2023-03-31 NOTE — Assessment & Plan Note (Signed)
Sleeping ok

## 2023-03-31 NOTE — Assessment & Plan Note (Signed)
Levsin prn 

## 2023-03-31 NOTE — Assessment & Plan Note (Signed)
CT w/one new gallstone

## 2023-03-31 NOTE — Progress Notes (Signed)
Subjective:  Patient ID: Jessica Rowland, female    DOB: July 24, 1932  Age: 89 y.o. MRN: [id number]  CC: Medical Management of Chronic Issues (Go over CT)   HPI Jessica Rowland presents for RUQ pain, gallstone, constipation Same sx's Not better, not worse She is here w/her dtr  Outpatient Medications Prior to Visit  Medication Sig Dispense Refill   brimonidine-timolol (COMBIGAN) 0.2-0.5 % ophthalmic solution Apply to eye.     Cholecalciferol (VITAMIN D3) 2000 units capsule Take 1 capsule (2,000 Units total) by mouth daily. 100 capsule 3   diphenhydrAMINE (BENADRYL ALLERGY) 25 MG tablet Take 0.5-1 tablets (12.5-25 mg total) by mouth at bedtime as needed for sleep. 60 tablet 3   ketorolac (ACULAR) 0.5 % ophthalmic solution      potassium chloride (KLOR-CON M) 10 MEQ tablet Take 1 tablet (10 mEq total) by mouth 2 (two) times daily. 60 tablet 1   prednisoLONE acetate (PRED FORTE) 1 % ophthalmic suspension      sennosides-docusate sodium (SENOKOT-S) 8.6-50 MG tablet Take 1 tablet by mouth daily. 90 tablet 1   lipase/protease/amylase (CREON) 36000 UNITS CPEP capsule Take 1 capsule (36,000 Units total) by mouth 3 (three) times daily before meals. Take with the first bite of food 180 capsule 3   pantoprazole (PROTONIX) 40 MG tablet Take 1 tablet (40 mg total) by mouth daily. 90 tablet 1   cephALEXin (KEFLEX) 500 MG capsule Take 1 capsule (500 mg total) by mouth 3 (three) times daily. (Patient not taking: Reported on 03/31/2023) 15 capsule 0   No facility-administered medications prior to visit.    ROS: Review of Systems  Constitutional:  Negative for activity change, appetite change, chills, fatigue and unexpected weight change.  HENT:  Negative for congestion, mouth sores and sinus pressure.   Eyes:  Negative for visual disturbance.  Respiratory:  Negative for cough and chest tightness.   Gastrointestinal:  Negative for abdominal pain and nausea.  Genitourinary:  Negative for difficulty  urinating, frequency and vaginal pain.  Musculoskeletal:  Negative for back pain and gait problem.  Skin:  Negative for pallor and rash.  Neurological:  Negative for dizziness, tremors, weakness, numbness and headaches.  Psychiatric/Behavioral:  Negative for confusion and sleep disturbance.     Objective:  BP (!) 138/56 (BP Location: Left Arm, Patient Position: Sitting, Cuff Size: Normal)   Pulse 76   Temp 97.6 F (36.4 C) (Temporal)   Ht 5' (1.524 m)   Wt 95 lb (43.1 kg)   SpO2 99%   BMI 18.55 kg/m   BP Readings from Last 3 Encounters:  03/31/23 (!) 138/56  03/10/23 (!) 144/76  11/11/22 120/64    Wt Readings from Last 3 Encounters:  03/31/23 95 lb (43.1 kg)  03/10/23 91 lb 8 oz (41.5 kg)  11/11/22 89 lb (40.4 kg)    Physical Exam Constitutional:      General: She is not in acute distress.    Appearance: She is well-developed.  HENT:     Head: Normocephalic.     Right Ear: External ear normal.     Left Ear: External ear normal.     Nose: Nose normal.  Eyes:     General:        Right eye: No discharge.        Left eye: No discharge.     Conjunctiva/sclera: Conjunctivae normal.     Pupils: Pupils are equal, round, and reactive to light.  Neck:     Thyroid: No thyromegaly.  Vascular: No JVD.     Trachea: No tracheal deviation.  Cardiovascular:     Rate and Rhythm: Normal rate and regular rhythm.     Heart sounds: Normal heart sounds.  Pulmonary:     Effort: No respiratory distress.     Breath sounds: No stridor. No wheezing.  Abdominal:     General: Bowel sounds are normal. There is no distension.     Palpations: Abdomen is soft. There is no mass.     Tenderness: There is no abdominal tenderness. There is no guarding or rebound.  Musculoskeletal:        General: No tenderness.     Cervical back: Normal range of motion and neck supple. No rigidity.  Lymphadenopathy:     Cervical: No cervical adenopathy.  Skin:    Findings: No erythema or rash.   Neurological:     Cranial Nerves: No cranial nerve deficit.     Motor: No abnormal muscle tone.     Coordination: Coordination normal.     Deep Tendon Reflexes: Reflexes normal.  Psychiatric:        Behavior: Behavior normal.        Thought Content: Thought content normal.        Judgment: Judgment normal.     A total time of 45 minutes was spent preparing to see the patient, reviewing tests, x-rays, operative reports and other medical records.  Also, obtaining history and performing comprehensive physical exam.  Additionally, counseling the patient regarding the above listed issues.   Finally, documenting clinical information in the health records, coordination of care, educating the patient/dtr re: gallstone, renal cancer. It is a complex case.   Lab Results  Component Value Date   WBC 7.3 03/10/2023   HGB 13.7 03/10/2023   HCT 41.3 03/10/2023   PLT 295.0 03/10/2023   GLUCOSE 87 03/10/2023   CHOL 259 (H) 03/16/2021   TRIG 203.0 (H) 03/16/2021   HDL 72.50 03/16/2021   LDLDIRECT 158.0 03/16/2021   ALT 18 03/10/2023   AST 17 03/10/2023   NA 130 (L) 03/10/2023   K 4.5 03/10/2023   CL 97 03/10/2023   CREATININE 0.53 03/10/2023   BUN 13 03/10/2023   CO2 27 03/10/2023   TSH 1.15 11/11/2022    CT ABDOMEN PELVIS W CONTRAST  Result Date: 03/29/2023 CLINICAL DATA:  Abdominal pain, right upper quadrant. Renal cancer, right. Calculus of gallbladder without cholecystitis without obstruction. EXAM: CT ABDOMEN AND PELVIS WITH CONTRAST TECHNIQUE: Multidetector CT imaging of the abdomen and pelvis was performed using the standard protocol following bolus administration of intravenous contrast. RADIATION DOSE REDUCTION: This exam was performed according to the departmental dose-optimization program which includes automated exposure control, adjustment of the mA and/or kV according to patient size and/or use of iterative reconstruction technique. CONTRAST:  75mL ISOVUE-300 IOPAMIDOL  (ISOVUE-300) INJECTION 61% COMPARISON:  CT 01/14/2022 FINDINGS: Lower chest: Mild cardiomegaly, chronic. No focal airspace disease or pleural effusion. Hepatobiliary: No focal hepatic abnormality. Small calcified gallstone in the gallbladder fundus. Gallbladder is physiologically distended. No pericholecystic inflammation or evidence of gallbladder wall thickening. Common bile duct measures 6 mm, normal. No visualized choledocholithiasis. Pancreas: No pancreatic inflammation or ductal dilatation. No solid pancreatic mass. Spleen: Normal in size without focal abnormality. Adrenals/Urinary Tract: Normal adrenal glands. Solid enhancing lesion in the anterior mid right kidney measures approximately 15 x 13 mm series 2, image 31. This is unchanged in size from prior exam. No involvement of the renal vascular structures. Several cortical cysts  and low-density lesions too small to characterize, unchanged. No further follow-up imaging of the cysts is recommended. Symmetric prominence of both renal collecting systems, however no hydronephrosis. No ureteral dilatation. No renal calculi. Urinary bladder is moderately distended. No bladder wall thickening. Stomach/Bowel: The stomach is nondistended. There is no small bowel obstruction or inflammatory change. Administered enteric contrast reaches the ascending colon. No terminal ileal inflammation. Normal appendix. Moderate volume of stool throughout the entire colon. No colonic wall thickening, pericolonic edema or significant diverticular disease. No abnormal rectal distention. Vascular/Lymphatic: Aorto bi-iliac atherosclerosis. Aortic tortuosity, no aneurysm. Patent portal vein. No suspicious abdominal adenopathy. No enlarged retroperitoneal nodes. Reproductive: Status post hysterectomy. No adnexal masses. Other: No ascites.  No abdominal wall hernia. Musculoskeletal: The bones are subjectively under mineralized. Moderate lumbar degenerative change. There are Tarvlov cysts in  the sacrum. No suspicious bone lesion. IMPRESSION: 1. No acute abnormality. 2. Cholelithiasis without CT findings of acute cholecystitis. 3. Solid enhancing lesion in the anterior mid right kidney measuring 15 x 13 mm, unchanged in size from prior exam. This is suspicious for renal cell carcinoma. 4. Moderate colonic stool burden, can be seen with constipation. Aortic Atherosclerosis (ICD10-I70.0). Electronically Signed   By: Narda Rutherford M.D.   On: 03/29/2023 16:22    Assessment & Plan:   Problem List Items Addressed This Visit     Cholelithiasis - Primary    CT w/one new gallstone Pt declined surgical consult Start Actigall Pt declined surgical consult Start Actigall Levsin prn      Abdominal pain    Levsin prn      Fatigue    Sleeping ok      Renal mass, right    Urology: seeing Dr. Laverle Patter: not a surgical candidate Abd CT 2024 - no change      Renal cancer, right (HCC)    CT w/one new gallstone          Meds ordered this encounter  Medications   DISCONTD: polyethylene glycol powder (GLYCOLAX/MIRALAX) 17 GM/SCOOP powder    Sig: Take 17 g by mouth 2 (two) times daily as needed for moderate constipation.    Dispense:  500 g    Refill:  5   ursodiol (ACTIGALL) 300 MG capsule    Sig: Take 1 capsule (300 mg total) by mouth 2 (two) times daily.    Dispense:  60 capsule    Refill:  5   hyoscyamine (LEVSIN) 0.125 MG tablet    Sig: Take 1-2 tablets (0.125-0.25 mg total) by mouth every 4 (four) hours as needed for cramping.    Dispense:  100 tablet    Refill:  3   polyethylene glycol powder (GLYCOLAX/MIRALAX) 17 GM/SCOOP powder    Sig: Take 17 g by mouth 2 (two) times daily as needed for moderate constipation.    Dispense:  500 g    Refill:  5      Follow-up: Return in about 3 months (around 07/01/2023) for a follow-up visit.  Sonda Primes, MD

## 2023-03-31 NOTE — Assessment & Plan Note (Signed)
Urology: seeing Dr. Laverle Patter: not a surgical candidate Abd CT 2024 - no change

## 2023-03-31 NOTE — Assessment & Plan Note (Addendum)
CT w/one new gallstone Pt declined surgical consult Start Actigall Pt declined surgical consult Start Actigall Levsin prn

## 2023-04-07 ENCOUNTER — Encounter (INDEPENDENT_AMBULATORY_CARE_PROVIDER_SITE_OTHER): Payer: Medicare Other | Admitting: Ophthalmology

## 2023-04-07 DIAGNOSIS — H35033 Hypertensive retinopathy, bilateral: Secondary | ICD-10-CM | POA: Diagnosis not present

## 2023-04-07 DIAGNOSIS — H353132 Nonexudative age-related macular degeneration, bilateral, intermediate dry stage: Secondary | ICD-10-CM

## 2023-04-07 DIAGNOSIS — I10 Essential (primary) hypertension: Secondary | ICD-10-CM

## 2023-04-07 DIAGNOSIS — H43813 Vitreous degeneration, bilateral: Secondary | ICD-10-CM

## 2023-04-07 DIAGNOSIS — H59033 Cystoid macular edema following cataract surgery, bilateral: Secondary | ICD-10-CM

## 2023-05-26 DIAGNOSIS — H00021 Hordeolum internum right upper eyelid: Secondary | ICD-10-CM | POA: Diagnosis not present

## 2023-06-09 ENCOUNTER — Encounter (INDEPENDENT_AMBULATORY_CARE_PROVIDER_SITE_OTHER): Payer: Medicare Other | Admitting: Ophthalmology

## 2023-06-09 DIAGNOSIS — H43813 Vitreous degeneration, bilateral: Secondary | ICD-10-CM | POA: Diagnosis not present

## 2023-06-09 DIAGNOSIS — H59033 Cystoid macular edema following cataract surgery, bilateral: Secondary | ICD-10-CM | POA: Diagnosis not present

## 2023-06-09 DIAGNOSIS — H353132 Nonexudative age-related macular degeneration, bilateral, intermediate dry stage: Secondary | ICD-10-CM

## 2023-06-16 DIAGNOSIS — H00021 Hordeolum internum right upper eyelid: Secondary | ICD-10-CM | POA: Diagnosis not present

## 2023-06-18 DIAGNOSIS — Z23 Encounter for immunization: Secondary | ICD-10-CM | POA: Diagnosis not present

## 2023-06-20 DIAGNOSIS — Z23 Encounter for immunization: Secondary | ICD-10-CM | POA: Diagnosis not present

## 2023-07-18 DIAGNOSIS — H40023 Open angle with borderline findings, high risk, bilateral: Secondary | ICD-10-CM | POA: Diagnosis not present

## 2023-07-18 DIAGNOSIS — H35343 Macular cyst, hole, or pseudohole, bilateral: Secondary | ICD-10-CM | POA: Diagnosis not present

## 2023-07-18 DIAGNOSIS — H353132 Nonexudative age-related macular degeneration, bilateral, intermediate dry stage: Secondary | ICD-10-CM | POA: Diagnosis not present

## 2023-07-18 DIAGNOSIS — H40053 Ocular hypertension, bilateral: Secondary | ICD-10-CM | POA: Diagnosis not present

## 2023-08-19 ENCOUNTER — Encounter (INDEPENDENT_AMBULATORY_CARE_PROVIDER_SITE_OTHER): Payer: Medicare Other | Admitting: Ophthalmology

## 2023-08-19 DIAGNOSIS — H353132 Nonexudative age-related macular degeneration, bilateral, intermediate dry stage: Secondary | ICD-10-CM

## 2023-08-19 DIAGNOSIS — H43813 Vitreous degeneration, bilateral: Secondary | ICD-10-CM

## 2023-08-19 DIAGNOSIS — H59033 Cystoid macular edema following cataract surgery, bilateral: Secondary | ICD-10-CM

## 2023-10-08 ENCOUNTER — Ambulatory Visit: Payer: Medicare Other | Admitting: Internal Medicine

## 2023-10-10 ENCOUNTER — Ambulatory Visit: Payer: Medicare Other | Admitting: Internal Medicine

## 2023-10-14 ENCOUNTER — Encounter: Payer: Self-pay | Admitting: Internal Medicine

## 2023-10-14 ENCOUNTER — Ambulatory Visit: Payer: Medicare Other | Admitting: Internal Medicine

## 2023-10-14 VITALS — BP 98/64 | HR 83 | Temp 98.5°F | Ht 60.0 in | Wt 93.0 lb

## 2023-10-14 DIAGNOSIS — N2889 Other specified disorders of kidney and ureter: Secondary | ICD-10-CM

## 2023-10-14 DIAGNOSIS — C641 Malignant neoplasm of right kidney, except renal pelvis: Secondary | ICD-10-CM | POA: Diagnosis not present

## 2023-10-14 DIAGNOSIS — R5383 Other fatigue: Secondary | ICD-10-CM | POA: Diagnosis not present

## 2023-10-14 DIAGNOSIS — R634 Abnormal weight loss: Secondary | ICD-10-CM

## 2023-10-14 DIAGNOSIS — R413 Other amnesia: Secondary | ICD-10-CM

## 2023-10-14 DIAGNOSIS — K8020 Calculus of gallbladder without cholecystitis without obstruction: Secondary | ICD-10-CM | POA: Diagnosis not present

## 2023-10-14 DIAGNOSIS — E871 Hypo-osmolality and hyponatremia: Secondary | ICD-10-CM

## 2023-10-14 LAB — COMPREHENSIVE METABOLIC PANEL
ALT: 21 U/L (ref 0–35)
AST: 16 U/L (ref 0–37)
Albumin: 4.1 g/dL (ref 3.5–5.2)
Alkaline Phosphatase: 49 U/L (ref 39–117)
BUN: 17 mg/dL (ref 6–23)
CO2: 27 meq/L (ref 19–32)
Calcium: 8.9 mg/dL (ref 8.4–10.5)
Chloride: 95 meq/L — ABNORMAL LOW (ref 96–112)
Creatinine, Ser: 0.45 mg/dL (ref 0.40–1.20)
GFR: 85.42 mL/min (ref >60.00)
Glucose, Bld: 94 mg/dL (ref 70–99)
Potassium: 4.3 meq/L (ref 3.5–5.1)
Sodium: 128 meq/L — ABNORMAL LOW (ref 135–145)
Total Bilirubin: 0.5 mg/dL (ref 0.2–1.2)
Total Protein: 6.5 g/dL (ref 6.0–8.3)

## 2023-10-14 LAB — CBC WITH DIFFERENTIAL/PLATELET
Basophils Absolute: 0.1 10*3/uL (ref 0.0–0.1)
Basophils Relative: 0.8 % (ref 0.0–3.0)
Eosinophils Absolute: 0.1 10*3/uL (ref 0.0–0.7)
Eosinophils Relative: 1.1 % (ref 0.0–5.0)
HCT: 40.2 % (ref 36.0–46.0)
Hemoglobin: 13.8 g/dL (ref 12.0–15.0)
Lymphocytes Relative: 29.8 % (ref 12.0–46.0)
Lymphs Abs: 2.3 10*3/uL (ref 0.7–4.0)
MCHC: 34.2 g/dL (ref 30.0–36.0)
MCV: 93.8 fl (ref 78.0–100.0)
Monocytes Absolute: 0.6 10*3/uL (ref 0.1–1.0)
Monocytes Relative: 7.7 % (ref 3.0–12.0)
Neutro Abs: 4.8 10*3/uL (ref 1.4–7.7)
Neutrophils Relative %: 60.6 % (ref 43.0–77.0)
Platelets: 262 10*3/uL (ref 150.0–400.0)
RBC: 4.29 Mil/uL (ref 3.87–5.11)
RDW: 13.7 % (ref 11.5–15.5)
WBC: 7.9 10*3/uL (ref 4.0–10.5)

## 2023-10-14 LAB — URINALYSIS, ROUTINE W REFLEX MICROSCOPIC
Bilirubin Urine: NEGATIVE
Hgb urine dipstick: NEGATIVE
Ketones, ur: NEGATIVE
Nitrite: POSITIVE — AB
RBC / HPF: NONE SEEN (ref 0–?)
Specific Gravity, Urine: 1.01 (ref 1.000–1.030)
Total Protein, Urine: NEGATIVE
Urine Glucose: NEGATIVE
Urobilinogen, UA: 0.2 (ref 0.0–1.0)
pH: 6 (ref 5.0–8.0)

## 2023-10-14 LAB — TSH: TSH: 1.62 u[IU]/mL (ref 0.35–5.50)

## 2023-10-14 LAB — T4, FREE: Free T4: 1.04 ng/dL (ref 0.60–1.60)

## 2023-10-14 MED ORDER — MEGARED OMEGA-3 KRILL OIL 500 MG PO CAPS: 1.0000 | ORAL_CAPSULE | Freq: Every morning | ORAL | Status: AC

## 2023-10-14 MED ORDER — PANTOPRAZOLE SODIUM 40 MG PO TBEC
40.0000 mg | DELAYED_RELEASE_TABLET | Freq: Every day | ORAL | 3 refills | Status: DC
Start: 2023-10-14 — End: 2024-03-29

## 2023-10-14 NOTE — Assessment & Plan Note (Signed)
 Check TSH

## 2023-10-14 NOTE — Assessment & Plan Note (Signed)
 Urology: seeing Dr. Laverle Patter: not a surgical candidate Abd CT 2024 - no change Pt declined [place]

## 2023-10-14 NOTE — Progress Notes (Signed)
 Subjective:  Patient ID: Jessica Rowland, female    DOB: 06-28-1932  Age: 89 y.o. MRN: [id number]  CC: Medical Management of Chronic Issues   HPI Jaidin Ugarte presents for constipation, GS, renal cancer  Outpatient Medications Prior to Visit  Medication Sig Dispense Refill   brimonidine-timolol (COMBIGAN) 0.2-0.5 % ophthalmic solution Apply to eye.     Cholecalciferol (VITAMIN D3) 2000 units capsule Take 1 capsule (2,000 Units total) by mouth daily. 100 capsule 3   diphenhydrAMINE (BENADRYL ALLERGY) 25 MG tablet Take 0.5-1 tablets (12.5-25 mg total) by mouth at bedtime as needed for sleep. 60 tablet 3   hyoscyamine (LEVSIN) 0.125 MG tablet Take 1-2 tablets (0.125-0.25 mg total) by mouth every 4 (four) hours as needed for cramping. 100 tablet 3   ketorolac (ACULAR) 0.5 % ophthalmic solution      polyethylene glycol powder (GLYCOLAX/MIRALAX) 17 GM/SCOOP powder Take 17 g by mouth 2 (two) times daily as needed for moderate constipation. 500 g 5   potassium chloride (KLOR-CON M) 10 MEQ tablet Take 1 tablet (10 mEq total) by mouth 2 (two) times daily. 60 tablet 1   prednisoLONE acetate (PRED FORTE) 1 % ophthalmic suspension      sennosides-docusate sodium (SENOKOT-S) 8.6-50 MG tablet Take 1 tablet by mouth daily. 90 tablet 1   ursodiol (ACTIGALL) 300 MG capsule Take 1 capsule (300 mg total) by mouth 2 (two) times daily. 60 capsule 5   No facility-administered medications prior to visit.    ROS: Review of Systems  Constitutional:  Negative for activity change, appetite change, chills, fatigue and unexpected weight change.  HENT:  Negative for congestion, mouth sores and sinus pressure.   Eyes:  Negative for visual disturbance.  Respiratory:  Negative for cough and chest tightness.   Gastrointestinal:  Negative for abdominal pain and nausea.  Genitourinary:  Negative for difficulty urinating, frequency and vaginal pain.  Musculoskeletal:  Positive for back pain and gait problem.   Skin:  Negative for pallor and rash.  Neurological:  Negative for dizziness, tremors, weakness, numbness and headaches.  Psychiatric/Behavioral:  Negative for confusion, decreased concentration, sleep disturbance and suicidal ideas.     Objective:  BP 98/64   Pulse 83   Temp 98.5 F (36.9 C) (Oral)   Ht 5' (1.524 m)   Wt 93 lb (42.2 kg)   SpO2 97%   BMI 18.16 kg/m   BP Readings from Last 3 Encounters:  10/14/23 98/64  03/31/23 (!) 138/56  03/10/23 (!) 144/76    Wt Readings from Last 3 Encounters:  10/14/23 93 lb (42.2 kg)  03/31/23 95 lb (43.1 kg)  03/10/23 91 lb 8 oz (41.5 kg)    Physical Exam Constitutional:      General: She is not in acute distress.    Appearance: She is well-developed. She is obese.  HENT:     Head: Normocephalic.     Right Ear: External ear normal.     Left Ear: External ear normal.     Nose: Nose normal.  Eyes:     General:        Right eye: No discharge.        Left eye: No discharge.     Conjunctiva/sclera: Conjunctivae normal.     Pupils: Pupils are equal, round, and reactive to light.  Neck:     Thyroid: No thyromegaly.     Vascular: No JVD.     Trachea: No tracheal deviation.  Cardiovascular:     Rate and Rhythm:  Normal rate and regular rhythm.     Heart sounds: Normal heart sounds.  Pulmonary:     Effort: No respiratory distress.     Breath sounds: No stridor. No wheezing.  Abdominal:     General: Bowel sounds are normal. There is no distension.     Palpations: Abdomen is soft. There is no mass.     Tenderness: There is no abdominal tenderness. There is no guarding or rebound.  Musculoskeletal:        General: No tenderness.     Cervical back: Normal range of motion and neck supple. No rigidity.  Lymphadenopathy:     Cervical: No cervical adenopathy.  Skin:    Findings: No erythema or rash.  Neurological:     Cranial Nerves: No cranial nerve deficit.     Motor: No abnormal muscle tone.     Coordination: Coordination  normal.     Deep Tendon Reflexes: Reflexes normal.  Psychiatric:        Behavior: Behavior normal.        Thought Content: Thought content normal.        Judgment: Judgment normal.     Lab Results  Component Value Date   WBC 7.3 03/10/2023   HGB 13.7 03/10/2023   HCT 41.3 03/10/2023   PLT 295.0 03/10/2023   GLUCOSE 87 03/10/2023   CHOL 259 (H) 03/16/2021   TRIG 203.0 (H) 03/16/2021   HDL 72.50 03/16/2021   LDLDIRECT 158.0 03/16/2021   ALT 18 03/10/2023   AST 17 03/10/2023   NA 130 (L) 03/10/2023   K 4.5 03/10/2023   CL 97 03/10/2023   CREATININE 0.53 03/10/2023   BUN 13 03/10/2023   CO2 27 03/10/2023   TSH 1.15 11/11/2022    CT ABDOMEN PELVIS W CONTRAST Result Date: 03/29/2023 CLINICAL DATA:  Abdominal pain, right upper quadrant. Renal cancer, right. Calculus of gallbladder without cholecystitis without obstruction. EXAM: CT ABDOMEN AND PELVIS WITH CONTRAST TECHNIQUE: Multidetector CT imaging of the abdomen and pelvis was performed using the standard protocol following bolus administration of intravenous contrast. RADIATION DOSE REDUCTION: This exam was performed according to the departmental dose-optimization program which includes automated exposure control, adjustment of the mA and/or kV according to patient size and/or use of iterative reconstruction technique. CONTRAST:  75mL ISOVUE-300 IOPAMIDOL (ISOVUE-300) INJECTION 61% COMPARISON:  CT 01/14/2022 FINDINGS: Lower chest: Mild cardiomegaly, chronic. No focal airspace disease or pleural effusion. Hepatobiliary: No focal hepatic abnormality. Small calcified gallstone in the gallbladder fundus. Gallbladder is physiologically distended. No pericholecystic inflammation or evidence of gallbladder wall thickening. Common bile duct measures 6 mm, normal. No visualized choledocholithiasis. Pancreas: No pancreatic inflammation or ductal dilatation. No solid pancreatic mass. Spleen: Normal in size without focal abnormality. Adrenals/Urinary  Tract: Normal adrenal glands. Solid enhancing lesion in the anterior mid right kidney measures approximately 15 x 13 mm series 2, image 31. This is unchanged in size from prior exam. No involvement of the renal vascular structures. Several cortical cysts and low-density lesions too small to characterize, unchanged. No further follow-up imaging of the cysts is recommended. Symmetric prominence of both renal collecting systems, however no hydronephrosis. No ureteral dilatation. No renal calculi. Urinary bladder is moderately distended. No bladder wall thickening. Stomach/Bowel: The stomach is nondistended. There is no small bowel obstruction or inflammatory change. Administered enteric contrast reaches the ascending colon. No terminal ileal inflammation. Normal appendix. Moderate volume of stool throughout the entire colon. No colonic wall thickening, pericolonic edema or significant diverticular disease. No abnormal  rectal distention. Vascular/Lymphatic: Aorto bi-iliac atherosclerosis. Aortic tortuosity, no aneurysm. Patent portal vein. No suspicious abdominal adenopathy. No enlarged retroperitoneal nodes. Reproductive: Status post hysterectomy. No adnexal masses. Other: No ascites.  No abdominal wall hernia. Musculoskeletal: The bones are subjectively under mineralized. Moderate lumbar degenerative change. There are Tarvlov cysts in the sacrum. No suspicious bone lesion. IMPRESSION: 1. No acute abnormality. 2. Cholelithiasis without CT findings of acute cholecystitis. 3. Solid enhancing lesion in the anterior mid right kidney measuring 15 x 13 mm, unchanged in size from prior exam. This is suspicious for renal cell carcinoma. 4. Moderate colonic stool burden, can be seen with constipation. Aortic Atherosclerosis (ICD10-I70.0). Electronically Signed   By: Narda Rutherford M.D.   On: 03/29/2023 16:22    Assessment & Plan:   Problem List Items Addressed This Visit     Cholelithiasis - Primary   Off Actigall No  pain      Fatigue   Check TSH      Relevant Orders   CBC with Differential/Platelet   Comprehensive metabolic panel   TSH   T4, free   Urinalysis   Hyponatremia   Unclear etiology fluid restriction Re-check c-Met May need to obtain urine osmolality and sodium      Relevant Orders   CBC with Differential/Platelet   Comprehensive metabolic panel   TSH   T4, free   Urinalysis   Memory problem   Resolved      Renal mass, right   Urology: seeing Dr. Laverle Patter: not a surgical candidate Abd CT 2024 - no change Pt declined [place]      Weight loss   Wt Readings from Last 3 Encounters:  10/14/23 93 lb (42.2 kg)  03/31/23 95 lb (43.1 kg)  03/10/23 91 lb 8 oz (41.5 kg)         Relevant Orders   CBC with Differential/Platelet   Comprehensive metabolic panel   TSH   T4, free   Urinalysis   Renal cancer, right [facility])   Urology: seeing Dr. Laverle Patter: not a surgical candidate Abd CT 2024 - no change Pt declined [place]         Meds ordered this encounter  Medications   pantoprazole (PROTONIX) 40 MG tablet    Sig: Take 1 tablet (40 mg total) by mouth daily.    Dispense:  90 tablet    Refill:  3   MegaRed Omega-3 Krill Oil 500 MG CAPS    Sig: Take 1 capsule by mouth every morning.      Follow-up: Return in about 6 months (around 04/15/2024) for a follow-up visit.  Sonda Primes, MD

## 2023-10-14 NOTE — Patient Instructions (Signed)
[facility] Emergency room  Address: [address], [place], [place] [address] Hours: Open 24 hours Phone: [phone]   [place] [facility] Address: [address] [place],  [address] Hours: Open 24 hours Phone: [phone]

## 2023-10-14 NOTE — Assessment & Plan Note (Signed)
 Off Actigall No pain

## 2023-10-14 NOTE — Assessment & Plan Note (Signed)
 Resolved

## 2023-10-14 NOTE — Assessment & Plan Note (Signed)
 Wt Readings from Last 3 Encounters:  10/14/23 93 lb (42.2 kg)  03/31/23 95 lb (43.1 kg)  03/10/23 91 lb 8 oz (41.5 kg)

## 2023-10-14 NOTE — Assessment & Plan Note (Signed)
Unclear etiology fluid restriction Re-check c-Met May need to obtain urine osmolality and sodium

## 2023-10-14 NOTE — Assessment & Plan Note (Signed)
Abd CT ordered

## 2023-10-15 ENCOUNTER — Encounter: Payer: Self-pay | Admitting: Internal Medicine

## 2023-10-15 ENCOUNTER — Other Ambulatory Visit: Payer: Self-pay | Admitting: Internal Medicine

## 2023-10-15 MED ORDER — NITROFURANTOIN MONOHYD MACRO 100 MG PO CAPS: 100.0000 mg | ORAL_CAPSULE | Freq: Two times a day (BID) | ORAL | 0 refills | Status: DC

## 2023-10-28 ENCOUNTER — Encounter (INDEPENDENT_AMBULATORY_CARE_PROVIDER_SITE_OTHER): Payer: Medicare Other | Admitting: Ophthalmology

## 2023-10-28 DIAGNOSIS — H59033 Cystoid macular edema following cataract surgery, bilateral: Secondary | ICD-10-CM | POA: Diagnosis not present

## 2023-10-28 DIAGNOSIS — H43813 Vitreous degeneration, bilateral: Secondary | ICD-10-CM

## 2023-10-28 DIAGNOSIS — H353132 Nonexudative age-related macular degeneration, bilateral, intermediate dry stage: Secondary | ICD-10-CM

## 2023-10-29 ENCOUNTER — Telehealth: Payer: Self-pay | Admitting: Internal Medicine

## 2023-10-29 NOTE — Telephone Encounter (Signed)
 Copied from CRM [id number]. Topic: General - Other >> Oct 29, 2023 11:22 AM Jessica Rowland wrote: Reason for CRM: Patient received a call to schedule her AWV;however, patient declined the appointment.

## 2023-12-30 ENCOUNTER — Ambulatory Visit (INDEPENDENT_AMBULATORY_CARE_PROVIDER_SITE_OTHER)

## 2023-12-30 ENCOUNTER — Ambulatory Visit (INDEPENDENT_AMBULATORY_CARE_PROVIDER_SITE_OTHER): Admitting: Internal Medicine

## 2023-12-30 ENCOUNTER — Encounter: Payer: Self-pay | Admitting: Internal Medicine

## 2023-12-30 VITALS — BP 150/88 | HR 81 | Temp 97.6°F | Ht 60.0 in

## 2023-12-30 DIAGNOSIS — R0781 Pleurodynia: Secondary | ICD-10-CM | POA: Diagnosis not present

## 2023-12-30 DIAGNOSIS — M545 Low back pain, unspecified: Secondary | ICD-10-CM | POA: Diagnosis not present

## 2023-12-30 DIAGNOSIS — G8929 Other chronic pain: Secondary | ICD-10-CM | POA: Insufficient documentation

## 2023-12-30 MED ORDER — TRAMADOL HCL 50 MG PO TABS: 50.0000 mg | ORAL_TABLET | Freq: Four times a day (QID) | ORAL | 1 refills | Status: DC | PRN

## 2023-12-30 MED ORDER — KETOROLAC TROMETHAMINE 30 MG/ML IJ SOLN
30.0000 mg | Freq: Once | INTRAMUSCULAR | Status: AC
  Administered 2023-12-30: 30 mg via INTRAMUSCULAR

## 2023-12-30 MED ORDER — METHYLPREDNISOLONE 4 MG PO TBPK: ORAL_TABLET | ORAL | 0 refills | Status: DC

## 2023-12-30 NOTE — Assessment & Plan Note (Addendum)
 Pain over L posterior lower ribs See plan for LBP Ribs X ray Toradol  30 mg IM

## 2023-12-30 NOTE — Assessment & Plan Note (Signed)
 New severe LBP x 10 d after sitting in a swivel chair x 2 hrs on the phone on 12/19/23. Then she could not get up due to pain Pain is 8/10 or more, constant, worse w/ROM Toradol  IM 30 mg Medrol pack Tramadol prn  Potential benefits of a short term opioids use as well as potential risks (i.e. addiction risk, apnea etc) and complications (i.e. Somnolence, constipation and others) were explained to the patient and were aknowledged. RTC 2 wks LS X ray, ribs X ray, UA Use heat

## 2023-12-30 NOTE — Progress Notes (Signed)
 Subjective:  Patient ID: Jessica Rowland, female    DOB: [birth year]  Age: 89 y.o. MRN: [id number]  CC: Back Pain (Patient notes excrutiating lower back pain for the last 10 days)   HPI Jessica Rowland presents for severe LBP x 10 d after sitting in a swivel chair x 2 hrs on the phone on 12/19/23. Then she could not get up due to pain Pain is 8/10 or more, constant, worse w/ROM    Outpatient Medications Prior to Visit  Medication Sig Dispense Refill   brimonidine-timolol (COMBIGAN) 0.2-0.5 % ophthalmic solution Apply to eye.     Cholecalciferol (VITAMIN D3) 2000 units capsule Take 1 capsule (2,000 Units total) by mouth daily. 100 capsule 3   diphenhydrAMINE  (BENADRYL  ALLERGY) 25 MG tablet Take 0.5-1 tablets (12.5-25 mg total) by mouth at bedtime as needed for sleep. 60 tablet 3   hyoscyamine  (LEVSIN) 0.125 MG tablet Take 1-2 tablets (0.125-0.25 mg total) by mouth every 4 (four) hours as needed for cramping. 100 tablet 3   ketorolac  (ACULAR ) 0.5 % ophthalmic solution      MegaRed Omega-3 Krill Oil 500 MG CAPS Take 1 capsule by mouth every morning.     nitrofurantoin , macrocrystal-monohydrate, (MACROBID ) 100 MG capsule Take 1 capsule (100 mg total) by mouth 2 (two) times daily. 8 capsule 0   pantoprazole  (PROTONIX ) 40 MG tablet Take 1 tablet (40 mg total) by mouth daily. 90 tablet 3   polyethylene glycol powder (GLYCOLAX /MIRALAX ) 17 GM/SCOOP powder Take 17 g by mouth 2 (two) times daily as needed for moderate constipation. 500 g 5   potassium chloride  (KLOR-CON  M) 10 MEQ tablet Take 1 tablet (10 mEq total) by mouth 2 (two) times daily. 60 tablet 1   prednisoLONE acetate (PRED FORTE) 1 % ophthalmic suspension      sennosides-docusate sodium  (SENOKOT-S) 8.6-50 MG tablet Take 1 tablet by mouth daily. 90 tablet 1   No facility-administered medications prior to visit.    ROS: Review of Systems  Constitutional:  Negative for activity change, appetite change, chills, fatigue and unexpected  weight change.  HENT:  Negative for congestion, mouth sores and sinus pressure.   Eyes:  Negative for visual disturbance.  Respiratory:  Negative for cough, chest tightness and shortness of breath.   Gastrointestinal:  Positive for constipation. Negative for abdominal pain, diarrhea and nausea.  Genitourinary:  Negative for difficulty urinating, frequency and vaginal pain.  Musculoskeletal:  Positive for back pain and gait problem. Negative for arthralgias and joint swelling.  Skin:  Negative for pallor and rash.  Neurological:  Negative for dizziness, tremors, weakness, numbness and headaches.  Hematological:  Does not bruise/bleed easily.  Psychiatric/Behavioral:  Negative for confusion and sleep disturbance.     Objective:  BP (!) 150/88   Pulse 81   Temp 97.6 F (36.4 C)   Ht 5' (1.524 m)   SpO2 97%   BMI 18.16 kg/m   BP Readings from Last 3 Encounters:  12/30/23 (!) 150/88  10/14/23 98/64  03/31/23 (!) 138/56    Wt Readings from Last 3 Encounters:  10/14/23 93 lb (42.2 kg)  03/31/23 95 lb (43.1 kg)  03/10/23 91 lb 8 oz (41.5 kg)    Physical Exam Constitutional:      General: She is not in acute distress.    Appearance: She is well-developed. She is not toxic-appearing.  HENT:     Head: Normocephalic.     Right Ear: External ear normal.     Left Ear: External ear  normal.     Nose: Nose normal.  Eyes:     General:        Right eye: No discharge.        Left eye: No discharge.     Conjunctiva/sclera: Conjunctivae normal.     Pupils: Pupils are equal, round, and reactive to light.  Neck:     Thyroid : No thyromegaly.     Vascular: No JVD.     Trachea: No tracheal deviation.  Cardiovascular:     Rate and Rhythm: Normal rate and regular rhythm.     Heart sounds: Normal heart sounds.     No friction rub.  Pulmonary:     Effort: No respiratory distress.     Breath sounds: No stridor. No wheezing.  Abdominal:     General: Bowel sounds are normal. There is no  distension.     Palpations: Abdomen is soft. There is no mass.     Tenderness: There is no abdominal tenderness. There is no guarding or rebound.  Musculoskeletal:        General: Tenderness present.     Cervical back: Normal range of motion and neck supple. No rigidity.     Right lower leg: No edema.     Left lower leg: No edema.  Lymphadenopathy:     Cervical: No cervical adenopathy.  Skin:    Coloration: Skin is not jaundiced.     Findings: No bruising, erythema or rash.  Neurological:     Mental Status: She is oriented to person, place, and time.     Cranial Nerves: No cranial nerve deficit.     Motor: No abnormal muscle tone.     Coordination: Coordination normal.     Gait: Gait abnormal.     Deep Tendon Reflexes: Reflexes normal.  Psychiatric:        Behavior: Behavior normal.        Thought Content: Thought content normal.        Judgment: Judgment normal.   Antalgic gait Pain over L posterior lower ribs to palpation LS - pain w/ROM No rash    A total time of 45 minutes was spent preparing to see the patient, reviewing her history, symptoms and her medical records.  Also, obtaining history and performing comprehensive physical exam.  Additionally, counseling the patient regarding the above listed issues: LBP, flank pain.   Finally, documenting clinical information in the health records, coordination of care, educating the patient.  Lab Results  Component Value Date   WBC 7.9 10/14/2023   HGB 13.8 10/14/2023   HCT 40.2 10/14/2023   PLT 262.0 10/14/2023   GLUCOSE 94 10/14/2023   CHOL 259 (H) 03/16/2021   TRIG 203.0 (H) 03/16/2021   HDL 72.50 03/16/2021   LDLDIRECT 158.0 03/16/2021   ALT 21 10/14/2023   AST 16 10/14/2023   NA 128 (L) 10/14/2023   K 4.3 10/14/2023   CL 95 (L) 10/14/2023   CREATININE 0.45 10/14/2023   BUN 17 10/14/2023   CO2 27 10/14/2023   TSH 1.62 10/14/2023    CT ABDOMEN PELVIS W CONTRAST Result Date: 03/29/2023 CLINICAL DATA:  Abdominal  pain, right upper quadrant. Renal cancer, right. Calculus of gallbladder without cholecystitis without obstruction. EXAM: CT ABDOMEN AND PELVIS WITH CONTRAST TECHNIQUE: Multidetector CT imaging of the abdomen and pelvis was performed using the standard protocol following bolus administration of intravenous contrast. RADIATION DOSE REDUCTION: This exam was performed according to the departmental dose-optimization program which includes automated exposure control, adjustment  of the mA and/or kV according to patient size and/or use of iterative reconstruction technique. CONTRAST:  75mL ISOVUE -300 IOPAMIDOL  (ISOVUE -300) INJECTION 61% COMPARISON:  CT 01/14/2022 FINDINGS: Lower chest: Mild cardiomegaly, chronic. No focal airspace disease or pleural effusion. Hepatobiliary: No focal hepatic abnormality. Small calcified gallstone in the gallbladder fundus. Gallbladder is physiologically distended. No pericholecystic inflammation or evidence of gallbladder wall thickening. Common bile duct measures 6 mm, normal. No visualized choledocholithiasis. Pancreas: No pancreatic inflammation or ductal dilatation. No solid pancreatic mass. Spleen: Normal in size without focal abnormality. Adrenals/Urinary Tract: Normal adrenal glands. Solid enhancing lesion in the anterior mid right kidney measures approximately 15 x 13 mm series 2, image 31. This is unchanged in size from prior exam. No involvement of the renal vascular structures. Several cortical cysts and low-density lesions too small to characterize, unchanged. No further follow-up imaging of the cysts is recommended. Symmetric prominence of both renal collecting systems, however no hydronephrosis. No ureteral dilatation. No renal calculi. Urinary bladder is moderately distended. No bladder wall thickening. Stomach/Bowel: The stomach is nondistended. There is no small bowel obstruction or inflammatory change. Administered enteric contrast reaches the ascending colon. No terminal  ileal inflammation. Normal appendix. Moderate volume of stool throughout the entire colon. No colonic wall thickening, pericolonic edema or significant diverticular disease. No abnormal rectal distention. Vascular/Lymphatic: Aorto bi-iliac atherosclerosis. Aortic tortuosity, no aneurysm. Patent portal vein. No suspicious abdominal adenopathy. No enlarged retroperitoneal nodes. Reproductive: Status post hysterectomy. No adnexal masses. Other: No ascites.  No abdominal wall hernia. Musculoskeletal: The bones are subjectively under mineralized. Moderate lumbar degenerative change. There are Tarvlov cysts in the sacrum. No suspicious bone lesion. IMPRESSION: 1. No acute abnormality. 2. Cholelithiasis without CT findings of acute cholecystitis. 3. Solid enhancing lesion in the anterior mid right kidney measuring 15 x 13 mm, unchanged in size from prior exam. This is suspicious for renal cell carcinoma. 4. Moderate colonic stool burden, can be seen with constipation. Aortic Atherosclerosis (ICD10-I70.0). Electronically Signed   By: Chadwick Colonel M.D.   On: 03/29/2023 16:22    Assessment & Plan:   Problem List Items Addressed This Visit     Rib pain   Pain over L posterior lower ribs See plan for LBP Ribs X ray Toradol  30 mg IM      Relevant Orders   DG Ribs Unilateral Left   Acute left-sided low back pain without sciatica - Primary   New severe LBP x 10 d after sitting in a swivel chair x 2 hrs on the phone on 12/19/23. Then she could not get up due to pain Pain is 8/10 or more, constant, worse w/ROM Toradol  IM 30 mg Medrol pack Tramadol prn  Potential benefits of a short term opioids use as well as potential risks (i.e. addiction risk, apnea etc) and complications (i.e. Somnolence, constipation and others) were explained to the patient and were aknowledged. RTC 2 wks LS X ray, ribs X ray, UA Use heat       Relevant Medications   traMADol (ULTRAM) 50 MG tablet   methylPREDNISolone (MEDROL  DOSEPAK) 4 MG TBPK tablet   Other Relevant Orders   DG Ribs Unilateral Left   Urinalysis      Meds ordered this encounter  Medications   traMADol (ULTRAM) 50 MG tablet    Sig: Take 1 tablet (50 mg total) by mouth every 6 (six) hours as needed.    Dispense:  20 tablet    Refill:  1   methylPREDNISolone (MEDROL DOSEPAK)  4 MG TBPK tablet    Sig: As directed    Dispense:  21 tablet    Refill:  0   ketorolac  (TORADOL ) 30 MG/ML injection 30 mg      Follow-up: Return in about 2 weeks (around 01/13/2024) for a follow-up visit.  Anitra Barn, MD

## 2023-12-31 LAB — URINALYSIS, ROUTINE W REFLEX MICROSCOPIC
Bilirubin Urine: NEGATIVE
Hgb urine dipstick: NEGATIVE
Ketones, ur: NEGATIVE
Nitrite: POSITIVE — AB
RBC / HPF: NONE SEEN (ref 0–?)
Specific Gravity, Urine: 1.01 (ref 1.000–1.030)
Urine Glucose: NEGATIVE
Urobilinogen, UA: 0.2 (ref 0.0–1.0)
pH: 6 (ref 5.0–8.0)

## 2024-01-01 ENCOUNTER — Telehealth: Payer: Self-pay | Admitting: Internal Medicine

## 2024-01-01 ENCOUNTER — Ambulatory Visit: Payer: Self-pay | Admitting: Internal Medicine

## 2024-01-01 DIAGNOSIS — N390 Urinary tract infection, site not specified: Secondary | ICD-10-CM | POA: Insufficient documentation

## 2024-01-01 MED ORDER — CEFUROXIME AXETIL 250 MG PO TABS: 250.0000 mg | ORAL_TABLET | Freq: Two times a day (BID) | ORAL | 0 refills | Status: DC

## 2024-01-01 NOTE — Telephone Encounter (Signed)
 Copied from CRM [id number]. Topic: Clinical - Medication Question >> Jan 01, 2024  3:46 PM Kita Perish H wrote: Reason for CRM: Patients daughter is calling stating Dr. Georgia Kipper prescribed patient a medication that's an opioid based the traMADol (ULTRAM) 50 MG tablet, wants something that's not opioid based. States the medication helped with the pain but makes patient a little dizzy so they haven't given it to her since yesterday and she's fine today, states it happened to patient before a while ago.  Natalie  [phone]

## 2024-01-01 NOTE — Assessment & Plan Note (Signed)
Ceftin x 7 d 

## 2024-01-07 DIAGNOSIS — R29898 Other symptoms and signs involving the musculoskeletal system: Secondary | ICD-10-CM | POA: Diagnosis not present

## 2024-01-07 DIAGNOSIS — M419 Scoliosis, unspecified: Secondary | ICD-10-CM | POA: Diagnosis not present

## 2024-01-07 DIAGNOSIS — M545 Low back pain, unspecified: Secondary | ICD-10-CM | POA: Diagnosis not present

## 2024-01-07 MED ORDER — CELECOXIB 100 MG PO CAPS: 100.0000 mg | ORAL_CAPSULE | Freq: Two times a day (BID) | ORAL | 1 refills | Status: DC

## 2024-01-07 NOTE — Telephone Encounter (Signed)
 Noted.  Celebrex prescription emailed.  Thanks

## 2024-01-12 ENCOUNTER — Ambulatory Visit: Admitting: Internal Medicine

## 2024-01-19 ENCOUNTER — Encounter (INDEPENDENT_AMBULATORY_CARE_PROVIDER_SITE_OTHER): Admitting: Ophthalmology

## 2024-01-19 DIAGNOSIS — H43813 Vitreous degeneration, bilateral: Secondary | ICD-10-CM | POA: Diagnosis not present

## 2024-01-19 DIAGNOSIS — H59033 Cystoid macular edema following cataract surgery, bilateral: Secondary | ICD-10-CM | POA: Diagnosis not present

## 2024-01-20 DIAGNOSIS — G8929 Other chronic pain: Secondary | ICD-10-CM | POA: Diagnosis not present

## 2024-01-20 DIAGNOSIS — M545 Low back pain, unspecified: Secondary | ICD-10-CM | POA: Diagnosis not present

## 2024-01-26 ENCOUNTER — Ambulatory Visit: Admitting: Internal Medicine

## 2024-02-23 ENCOUNTER — Ambulatory Visit (INDEPENDENT_AMBULATORY_CARE_PROVIDER_SITE_OTHER): Admitting: Internal Medicine

## 2024-02-23 ENCOUNTER — Encounter: Payer: Self-pay | Admitting: Internal Medicine

## 2024-02-23 VITALS — BP 110/70 | HR 80 | Temp 98.3°F | Ht 60.0 in | Wt 90.0 lb

## 2024-02-23 DIAGNOSIS — M546 Pain in thoracic spine: Secondary | ICD-10-CM | POA: Diagnosis not present

## 2024-02-23 DIAGNOSIS — E871 Hypo-osmolality and hyponatremia: Secondary | ICD-10-CM

## 2024-02-23 DIAGNOSIS — H9193 Unspecified hearing loss, bilateral: Secondary | ICD-10-CM

## 2024-02-23 LAB — COMPREHENSIVE METABOLIC PANEL WITH GFR
ALT: 13 U/L (ref 0–35)
AST: 15 U/L (ref 0–37)
Albumin: 4.1 g/dL (ref 3.5–5.2)
Alkaline Phosphatase: 62 U/L (ref 39–117)
BUN: 15 mg/dL (ref 6–23)
CO2: 28 meq/L (ref 19–32)
Calcium: 8.9 mg/dL (ref 8.4–10.5)
Chloride: 94 meq/L — ABNORMAL LOW (ref 96–112)
Creatinine, Ser: 0.48 mg/dL (ref 0.40–1.20)
GFR: 83.89 mL/min (ref >60.00)
Glucose, Bld: 118 mg/dL — ABNORMAL HIGH (ref 70–99)
Potassium: 4.3 meq/L (ref 3.5–5.1)
Sodium: 127 meq/L — ABNORMAL LOW (ref 135–145)
Total Bilirubin: 0.4 mg/dL (ref 0.2–1.2)
Total Protein: 6.5 g/dL (ref 6.0–8.3)

## 2024-02-23 LAB — CBC WITH DIFFERENTIAL/PLATELET
Basophils Absolute: 0.1 K/uL (ref 0.0–0.1)
Basophils Relative: 0.7 % (ref 0.0–3.0)
Eosinophils Absolute: 0 K/uL (ref 0.0–0.7)
Eosinophils Relative: 0.6 % (ref 0.0–5.0)
HCT: 38.6 % (ref 36.0–46.0)
Hemoglobin: 13 g/dL (ref 12.0–15.0)
Lymphocytes Relative: 29.1 % (ref 12.0–46.0)
Lymphs Abs: 2.1 K/uL (ref 0.7–4.0)
MCHC: 33.7 g/dL (ref 30.0–36.0)
MCV: 91 fl (ref 78.0–100.0)
Monocytes Absolute: 0.5 K/uL (ref 0.1–1.0)
Monocytes Relative: 7.1 % (ref 3.0–12.0)
Neutro Abs: 4.5 K/uL (ref 1.4–7.7)
Neutrophils Relative %: 62.5 % (ref 43.0–77.0)
Platelets: 284 K/uL (ref 150.0–400.0)
RBC: 4.24 Mil/uL (ref 3.87–5.11)
RDW: 13.6 % (ref 11.5–15.5)
WBC: 7.2 K/uL (ref 4.0–10.5)

## 2024-02-23 NOTE — Assessment & Plan Note (Signed)
 Stable

## 2024-02-23 NOTE — Assessment & Plan Note (Signed)
Will check CMET ?

## 2024-02-23 NOTE — Progress Notes (Signed)
 Subjective:  Patient ID: Jessica Rowland, female    DOB: Jul 11, 1932  Age: 89 y.o. MRN: [id number]  CC: Back Pain   HPI Jessica Rowland presents for LBP - better Stretching 1 h/d F/u on low sodium Pt saw orthopedist - (-) X rays  Outpatient Medications Prior to Visit  Medication Sig Dispense Refill   brimonidine-timolol (COMBIGAN) 0.2-0.5 % ophthalmic solution Apply to eye.     cefUROXime  (CEFTIN ) 250 MG tablet Take 1 tablet (250 mg total) by mouth 2 (two) times daily with a meal. 14 tablet 0   celecoxib  (CELEBREX ) 100 MG capsule Take 1 capsule (100 mg total) by mouth 2 (two) times daily. 60 capsule 1   Cholecalciferol (VITAMIN D3) 2000 units capsule Take 1 capsule (2,000 Units total) by mouth daily. 100 capsule 3   diphenhydrAMINE  (BENADRYL  ALLERGY) 25 MG tablet Take 0.5-1 tablets (12.5-25 mg total) by mouth at bedtime as needed for sleep. 60 tablet 3   hyoscyamine  (LEVSIN ) 0.125 MG tablet Take 1-2 tablets (0.125-0.25 mg total) by mouth every 4 (four) hours as needed for cramping. 100 tablet 3   ketorolac  (ACULAR ) 0.5 % ophthalmic solution      MegaRed Omega-3 Krill Oil 500 MG CAPS Take 1 capsule by mouth every morning.     methylPREDNISolone  (MEDROL  DOSEPAK) 4 MG TBPK tablet As directed 21 tablet 0   nitrofurantoin , macrocrystal-monohydrate, (MACROBID ) 100 MG capsule Take 1 capsule (100 mg total) by mouth 2 (two) times daily. 8 capsule 0   pantoprazole  (PROTONIX ) 40 MG tablet Take 1 tablet (40 mg total) by mouth daily. 90 tablet 3   polyethylene glycol powder (GLYCOLAX /MIRALAX ) 17 GM/SCOOP powder Take 17 g by mouth 2 (two) times daily as needed for moderate constipation. 500 g 5   potassium chloride  (KLOR-CON  M) 10 MEQ tablet Take 1 tablet (10 mEq total) by mouth 2 (two) times daily. 60 tablet 1   prednisoLONE acetate (PRED FORTE) 1 % ophthalmic suspension      sennosides-docusate sodium  (SENOKOT-S) 8.6-50 MG tablet Take 1 tablet by mouth daily. 90 tablet 1   No  facility-administered medications prior to visit.    ROS: Review of Systems  Constitutional:  Negative for activity change, appetite change, chills, fatigue and unexpected weight change.  HENT:  Negative for congestion, mouth sores and sinus pressure.   Eyes:  Negative for visual disturbance.  Respiratory:  Negative for cough and chest tightness.   Gastrointestinal:  Negative for abdominal pain and nausea.  Genitourinary:  Negative for difficulty urinating, frequency and vaginal pain.  Musculoskeletal:  Positive for back pain. Negative for gait problem.  Skin:  Negative for pallor and rash.  Neurological:  Negative for dizziness, tremors, weakness, numbness and headaches.  Psychiatric/Behavioral:  Negative for confusion and sleep disturbance.     Objective:  BP 110/70   Pulse 80   Temp 98.3 F (36.8 C) (Oral)   Ht 5' (1.524 m)   Wt 90 lb (40.8 kg)   SpO2 90%   BMI 17.58 kg/m   BP Readings from Last 3 Encounters:  02/23/24 110/70  12/30/23 (!) 150/88  10/14/23 98/64    Wt Readings from Last 3 Encounters:  02/23/24 90 lb (40.8 kg)  10/14/23 93 lb (42.2 kg)  03/31/23 95 lb (43.1 kg)    Physical Exam Constitutional:      General: She is not in acute distress.    Appearance: She is well-developed.  HENT:     Head: Normocephalic.     Right Ear: External  ear normal.     Left Ear: External ear normal.     Nose: Nose normal.  Eyes:     General:        Right eye: No discharge.        Left eye: No discharge.     Conjunctiva/sclera: Conjunctivae normal.     Pupils: Pupils are equal, round, and reactive to light.  Neck:     Thyroid : No thyromegaly.     Vascular: No JVD.     Trachea: No tracheal deviation.  Cardiovascular:     Rate and Rhythm: Normal rate and regular rhythm.     Heart sounds: Normal heart sounds.  Pulmonary:     Effort: No respiratory distress.     Breath sounds: No stridor. No wheezing.  Abdominal:     General: Bowel sounds are normal. There is no  distension.     Palpations: Abdomen is soft. There is no mass.     Tenderness: There is no abdominal tenderness. There is no guarding or rebound.  Musculoskeletal:        General: No tenderness.     Cervical back: Normal range of motion and neck supple. No rigidity.  Lymphadenopathy:     Cervical: No cervical adenopathy.  Skin:    Findings: No erythema or rash.  Neurological:     Cranial Nerves: No cranial nerve deficit.     Motor: No abnormal muscle tone.     Coordination: Coordination normal.     Deep Tendon Reflexes: Reflexes normal.  Psychiatric:        Behavior: Behavior normal.        Thought Content: Thought content normal.        Judgment: Judgment normal.   LS parasp muscle is tender  Lab Results  Component Value Date   WBC 7.9 10/14/2023   HGB 13.8 10/14/2023   HCT 40.2 10/14/2023   PLT 262.0 10/14/2023   GLUCOSE 94 10/14/2023   CHOL 259 (H) 03/16/2021   TRIG 203.0 (H) 03/16/2021   HDL 72.50 03/16/2021   LDLDIRECT 158.0 03/16/2021   ALT 21 10/14/2023   AST 16 10/14/2023   NA 128 (L) 10/14/2023   K 4.3 10/14/2023   CL 95 (L) 10/14/2023   CREATININE 0.45 10/14/2023   BUN 17 10/14/2023   CO2 27 10/14/2023   TSH 1.62 10/14/2023    CT ABDOMEN PELVIS W CONTRAST Result Date: 03/29/2023 CLINICAL DATA:  Abdominal pain, right upper quadrant. Renal cancer, right. Calculus of gallbladder without cholecystitis without obstruction. EXAM: CT ABDOMEN AND PELVIS WITH CONTRAST TECHNIQUE: Multidetector CT imaging of the abdomen and pelvis was performed using the standard protocol following bolus administration of intravenous contrast. RADIATION DOSE REDUCTION: This exam was performed according to the departmental dose-optimization program which includes automated exposure control, adjustment of the mA and/or kV according to patient size and/or use of iterative reconstruction technique. CONTRAST:  75mL ISOVUE -300 IOPAMIDOL  (ISOVUE -300) INJECTION 61% COMPARISON:  CT 01/14/2022  FINDINGS: Lower chest: Mild cardiomegaly, chronic. No focal airspace disease or pleural effusion. Hepatobiliary: No focal hepatic abnormality. Small calcified gallstone in the gallbladder fundus. Gallbladder is physiologically distended. No pericholecystic inflammation or evidence of gallbladder wall thickening. Common bile duct measures 6 mm, normal. No visualized choledocholithiasis. Pancreas: No pancreatic inflammation or ductal dilatation. No solid pancreatic mass. Spleen: Normal in size without focal abnormality. Adrenals/Urinary Tract: Normal adrenal glands. Solid enhancing lesion in the anterior mid right kidney measures approximately 15 x 13 mm series 2, image 31. This is  unchanged in size from prior exam. No involvement of the renal vascular structures. Several cortical cysts and low-density lesions too small to characterize, unchanged. No further follow-up imaging of the cysts is recommended. Symmetric prominence of both renal collecting systems, however no hydronephrosis. No ureteral dilatation. No renal calculi. Urinary bladder is moderately distended. No bladder wall thickening. Stomach/Bowel: The stomach is nondistended. There is no small bowel obstruction or inflammatory change. Administered enteric contrast reaches the ascending colon. No terminal ileal inflammation. Normal appendix. Moderate volume of stool throughout the entire colon. No colonic wall thickening, pericolonic edema or significant diverticular disease. No abnormal rectal distention. Vascular/Lymphatic: Aorto bi-iliac atherosclerosis. Aortic tortuosity, no aneurysm. Patent portal vein. No suspicious abdominal adenopathy. No enlarged retroperitoneal nodes. Reproductive: Status post hysterectomy. No adnexal masses. Other: No ascites.  No abdominal wall hernia. Musculoskeletal: The bones are subjectively under mineralized. Moderate lumbar degenerative change. There are Tarvlov cysts in the sacrum. No suspicious bone lesion. IMPRESSION: 1.  No acute abnormality. 2. Cholelithiasis without CT findings of acute cholecystitis. 3. Solid enhancing lesion in the anterior mid right kidney measuring 15 x 13 mm, unchanged in size from prior exam. This is suspicious for renal cell carcinoma. 4. Moderate colonic stool burden, can be seen with constipation. Aortic Atherosclerosis (ICD10-I70.0). Electronically Signed   By: Andrea Gasman M.D.   On: 03/29/2023 16:22    Assessment & Plan:   Problem List Items Addressed This Visit     Hearing loss - Primary   Stable      Relevant Orders   Comprehensive metabolic panel with GFR   CBC with Differential/Platelet   Urinalysis   CULTURE, URINE COMPREHENSIVE   Acute midline thoracic back pain   50% better Cont w/Celebrex  Handicapped plackard      Hyponatremia   Will check CMET      Relevant Orders   Comprehensive metabolic panel with GFR   CBC with Differential/Platelet   Urinalysis   CULTURE, URINE COMPREHENSIVE      No orders of the defined types were placed in this encounter.     Follow-up: Return in about 2 months (around 04/25/2024) for a follow-up visit.  Marolyn Noel, MD

## 2024-02-23 NOTE — Assessment & Plan Note (Signed)
 50% better Cont w/Celebrex  Handicapped plackard

## 2024-02-24 ENCOUNTER — Ambulatory Visit: Payer: Self-pay | Admitting: Internal Medicine

## 2024-02-24 LAB — URINALYSIS
Bilirubin Urine: NEGATIVE
Hgb urine dipstick: NEGATIVE
Ketones, ur: NEGATIVE
Leukocytes,Ua: NEGATIVE
Nitrite: NEGATIVE
Specific Gravity, Urine: <=1.005 — AB (ref 1.000–1.030)
Total Protein, Urine: NEGATIVE
Urine Glucose: NEGATIVE
Urobilinogen, UA: 0.2 (ref 0.0–1.0)
pH: 6 (ref 5.0–8.0)

## 2024-02-25 LAB — CULTURE, URINE COMPREHENSIVE

## 2024-03-01 MED ORDER — CEFUROXIME AXETIL 250 MG PO TABS: 250.0000 mg | ORAL_TABLET | Freq: Two times a day (BID) | ORAL | 0 refills | Status: DC

## 2024-03-02 ENCOUNTER — Ambulatory Visit: Admitting: Internal Medicine

## 2024-03-29 ENCOUNTER — Other Ambulatory Visit: Payer: Self-pay | Admitting: Internal Medicine

## 2024-03-29 MED ORDER — PANTOPRAZOLE SODIUM 40 MG PO TBEC: 40.0000 mg | DELAYED_RELEASE_TABLET | Freq: Every day | ORAL | 3 refills | Status: DC

## 2024-03-29 NOTE — Telephone Encounter (Signed)
 Copied from CRM #[id number]. Topic: Clinical - Medication Refill >> Mar 29, 2024  8:42 AM Tanazia G wrote: Medication:  pantoprazole  (PROTONIX ) 40 MG tablet   Has the patient contacted their pharmacy? Yes (Agent: If no, request that the patient contact the pharmacy for the refill. If patient does not wish to contact the pharmacy document the reason why and proceed with request.) (Agent: If yes, when and what did the pharmacy advise?)  This is the patient's preferred pharmacy:  [facility] [id number] [place], [place] - [address] [place] [address] Phone: [phone] Fax: [phone]  Is this the correct pharmacy for this prescription? Yes If no, delete pharmacy and type the correct one.   Has the prescription been filled recently? Yes  Is the patient out of the medication? Yes  Has the patient been seen for an appointment in the last year OR does the patient have an upcoming appointment? Yes  Can we respond through MyChart? Yes  Agent: Please be advised that Rx refills may take up to 3 business days. We ask that you follow-up with your pharmacy.

## 2024-04-26 ENCOUNTER — Encounter (INDEPENDENT_AMBULATORY_CARE_PROVIDER_SITE_OTHER): Admitting: Ophthalmology

## 2024-04-26 DIAGNOSIS — H353132 Nonexudative age-related macular degeneration, bilateral, intermediate dry stage: Secondary | ICD-10-CM | POA: Diagnosis not present

## 2024-04-26 DIAGNOSIS — H59033 Cystoid macular edema following cataract surgery, bilateral: Secondary | ICD-10-CM | POA: Diagnosis not present

## 2024-04-26 DIAGNOSIS — H43813 Vitreous degeneration, bilateral: Secondary | ICD-10-CM | POA: Diagnosis not present

## 2024-06-03 ENCOUNTER — Encounter: Payer: Self-pay | Admitting: Internal Medicine

## 2024-06-03 ENCOUNTER — Ambulatory Visit: Admitting: Internal Medicine

## 2024-06-03 VITALS — BP 122/58 | HR 60 | Temp 97.7°F | Ht 60.0 in | Wt 90.0 lb

## 2024-06-03 DIAGNOSIS — G8929 Other chronic pain: Secondary | ICD-10-CM

## 2024-06-03 DIAGNOSIS — L570 Actinic keratosis: Secondary | ICD-10-CM

## 2024-06-03 DIAGNOSIS — M546 Pain in thoracic spine: Secondary | ICD-10-CM | POA: Diagnosis not present

## 2024-06-03 MED ORDER — CELECOXIB 100 MG PO CAPS: 100.0000 mg | ORAL_CAPSULE | Freq: Two times a day (BID) | ORAL | 1 refills | Status: DC

## 2024-06-03 NOTE — Assessment & Plan Note (Addendum)
 Chronic moderate thoracic spine back pain on the left since 12/19/23. Probable facet joint arthritis versus other.  History of compression fracture of T5.  The patient refused x-rays RTC 2 wks RTC 2 wks Pain is 5-6 out of 10 now.  Celebrex  100 mg  helps.  Continue with heat, yoga, massage etc. PM referral to see Dr.Shtridelman was offered

## 2024-06-03 NOTE — Assessment & Plan Note (Signed)
 Large skin lesion on the left cheek.  Actinic keratosis, more likely a skin cancer.  The patient refused dermatology referral, excision.  She would like me to freeze it.  See picture.  See procedure See me back in a couple months if relapsed The small one is AK

## 2024-06-03 NOTE — Patient Instructions (Signed)
 Murray Collier, MD Specialties and/or Subspecialties Physical Medicine & Rehabilitation  [phone]

## 2024-06-03 NOTE — Progress Notes (Signed)
 Subjective:  Patient ID: Jessica Rowland, female    DOB: 1932-01-18  Age: 89 y.o. MRN: [id number]  CC: Back Pain (Back pain, basalioma (patient is post procedure for this and is having some issues with healing process, sometimes has bleeding))   HPI Jessica Rowland presents for LBP - L side 5-6/10 Celebrex  helped  C/o L cheek skin lesion - worse  She is here with her daughter     Outpatient Medications Prior to Visit  Medication Sig Dispense Refill   brimonidine-timolol (COMBIGAN) 0.2-0.5 % ophthalmic solution Apply to eye.     Cholecalciferol (VITAMIN D3) 2000 units capsule Take 1 capsule (2,000 Units total) by mouth daily. 100 capsule 3   ketorolac  (ACULAR ) 0.5 % ophthalmic solution      MegaRed Omega-3 Krill Oil 500 MG CAPS Take 1 capsule by mouth every morning.     pantoprazole  (PROTONIX ) 40 MG tablet Take 1 tablet (40 mg total) by mouth daily. 90 tablet 3   prednisoLONE acetate (PRED FORTE) 1 % ophthalmic suspension      sennosides-docusate sodium  (SENOKOT-S) 8.6-50 MG tablet Take 1 tablet by mouth daily. 90 tablet 1   celecoxib  (CELEBREX ) 100 MG capsule Take 1 capsule (100 mg total) by mouth 2 (two) times daily. 60 capsule 1   cefUROXime  (CEFTIN ) 250 MG tablet Take 1 tablet (250 mg total) by mouth 2 (two) times daily with a meal. 14 tablet 0   diphenhydrAMINE  (BENADRYL  ALLERGY) 25 MG tablet Take 0.5-1 tablets (12.5-25 mg total) by mouth at bedtime as needed for sleep. 60 tablet 3   hyoscyamine  (LEVSIN ) 0.125 MG tablet Take 1-2 tablets (0.125-0.25 mg total) by mouth every 4 (four) hours as needed for cramping. 100 tablet 3   methylPREDNISolone  (MEDROL  DOSEPAK) 4 MG TBPK tablet As directed 21 tablet 0   nitrofurantoin , macrocrystal-monohydrate, (MACROBID ) 100 MG capsule Take 1 capsule (100 mg total) by mouth 2 (two) times daily. 8 capsule 0   polyethylene glycol powder (GLYCOLAX /MIRALAX ) 17 GM/SCOOP powder Take 17 g by mouth 2 (two) times daily as needed for moderate  constipation. 500 g 5   potassium chloride  (KLOR-CON  M) 10 MEQ tablet Take 1 tablet (10 mEq total) by mouth 2 (two) times daily. 60 tablet 1   No facility-administered medications prior to visit.    ROS: Review of Systems  Constitutional:  Negative for activity change, appetite change, chills, fatigue and unexpected weight change.  HENT:  Negative for congestion, mouth sores and sinus pressure.   Eyes:  Negative for visual disturbance.  Respiratory:  Negative for cough and chest tightness.   Gastrointestinal:  Negative for abdominal pain and nausea.  Genitourinary:  Negative for difficulty urinating, frequency and vaginal pain.  Musculoskeletal:  Positive for back pain. Negative for gait problem.  Skin:  Positive for color change. Negative for pallor and rash.  Neurological:  Negative for dizziness, tremors, weakness, numbness and headaches.  Psychiatric/Behavioral:  Negative for confusion and sleep disturbance.     Objective:  BP (!) 122/58   Pulse 60   Temp 97.7 F (36.5 C)   Ht 5' (1.524 m)   Wt 90 lb (40.8 kg)   SpO2 99%   BMI 17.58 kg/m   BP Readings from Last 3 Encounters:  06/03/24 (!) 122/58  02/23/24 110/70  12/30/23 (!) 150/88    Wt Readings from Last 3 Encounters:  06/03/24 90 lb (40.8 kg)  02/23/24 90 lb (40.8 kg)  10/14/23 93 lb (42.2 kg)    Physical Exam Constitutional:  General: She is not in acute distress.    Appearance: She is well-developed.  HENT:     Head: Normocephalic.     Right Ear: External ear normal.     Left Ear: External ear normal.     Nose: Nose normal.  Eyes:     General:        Right eye: No discharge.        Left eye: No discharge.     Conjunctiva/sclera: Conjunctivae normal.     Pupils: Pupils are equal, round, and reactive to light.  Neck:     Thyroid : No thyromegaly.     Vascular: No JVD.     Trachea: No tracheal deviation.  Cardiovascular:     Rate and Rhythm: Normal rate and regular rhythm.     Heart sounds:  Normal heart sounds.  Pulmonary:     Effort: No respiratory distress.     Breath sounds: No stridor. No wheezing.  Abdominal:     General: Bowel sounds are normal. There is no distension.     Palpations: Abdomen is soft. There is no mass.     Tenderness: There is no abdominal tenderness. There is no guarding or rebound.  Musculoskeletal:        General: Tenderness present.     Cervical back: Normal range of motion and neck supple. No rigidity.  Lymphadenopathy:     Cervical: No cervical adenopathy.  Skin:    Findings: Lesion present. No erythema or rash.  Neurological:     Cranial Nerves: No cranial nerve deficit.     Motor: No abnormal muscle tone.     Coordination: Coordination normal.     Deep Tendon Reflexes: Reflexes normal.  Psychiatric:        Behavior: Behavior normal.        Thought Content: Thought content normal.        Judgment: Judgment normal.   See picture  Lower left thoracic spine is tender to palpation over facet joints   Procedure Note :     Procedure : Cryosurgery   Indication:  Actinic keratosis(es)   Risks including unsuccessful procedure , bleeding, infection, bruising, scar, a need for a repeat  procedure and others were explained to the patient in detail as well as the benefits. Informed consent was obtained verbally.   Too lesion(s)  on the left cheek (1 small one large) was/were treated with liquid nitrogen on a Q-tip in a usual fasion . Band-Aid was applied and antibiotic ointment was given for a later use.   Tolerated well. Complications none.   Postprocedure instructions :     Keep the wounds clean. You can wash them with liquid soap and water. Pat dry with gauze or a Kleenex tissue  Before applying antibiotic ointment and a Band-Aid.   You need to report immediately  if  any signs of infection develop.    Lab Results  Component Value Date   WBC 7.2 02/23/2024   HGB 13.0 02/23/2024   HCT 38.6 02/23/2024   PLT 284.0 02/23/2024    GLUCOSE 118 (H) 02/23/2024   CHOL 259 (H) 03/16/2021   TRIG 203.0 (H) 03/16/2021   HDL 72.50 03/16/2021   LDLDIRECT 158.0 03/16/2021   ALT 13 02/23/2024   AST 15 02/23/2024   NA 127 (L) 02/23/2024   K 4.3 02/23/2024   CL 94 (L) 02/23/2024   CREATININE 0.48 02/23/2024   BUN 15 02/23/2024   CO2 28 02/23/2024   TSH 1.62 10/14/2023  CT ABDOMEN PELVIS W CONTRAST Result Date: 03/29/2023 CLINICAL DATA:  Abdominal pain, right upper quadrant. Renal cancer, right. Calculus of gallbladder without cholecystitis without obstruction. EXAM: CT ABDOMEN AND PELVIS WITH CONTRAST TECHNIQUE: Multidetector CT imaging of the abdomen and pelvis was performed using the standard protocol following bolus administration of intravenous contrast. RADIATION DOSE REDUCTION: This exam was performed according to the departmental dose-optimization program which includes automated exposure control, adjustment of the mA and/or kV according to patient size and/or use of iterative reconstruction technique. CONTRAST:  75mL ISOVUE -300 IOPAMIDOL  (ISOVUE -300) INJECTION 61% COMPARISON:  CT 01/14/2022 FINDINGS: Lower chest: Mild cardiomegaly, chronic. No focal airspace disease or pleural effusion. Hepatobiliary: No focal hepatic abnormality. Small calcified gallstone in the gallbladder fundus. Gallbladder is physiologically distended. No pericholecystic inflammation or evidence of gallbladder wall thickening. Common bile duct measures 6 mm, normal. No visualized choledocholithiasis. Pancreas: No pancreatic inflammation or ductal dilatation. No solid pancreatic mass. Spleen: Normal in size without focal abnormality. Adrenals/Urinary Tract: Normal adrenal glands. Solid enhancing lesion in the anterior mid right kidney measures approximately 15 x 13 mm series 2, image 31. This is unchanged in size from prior exam. No involvement of the renal vascular structures. Several cortical cysts and low-density lesions too small to characterize,  unchanged. No further follow-up imaging of the cysts is recommended. Symmetric prominence of both renal collecting systems, however no hydronephrosis. No ureteral dilatation. No renal calculi. Urinary bladder is moderately distended. No bladder wall thickening. Stomach/Bowel: The stomach is nondistended. There is no small bowel obstruction or inflammatory change. Administered enteric contrast reaches the ascending colon. No terminal ileal inflammation. Normal appendix. Moderate volume of stool throughout the entire colon. No colonic wall thickening, pericolonic edema or significant diverticular disease. No abnormal rectal distention. Vascular/Lymphatic: Aorto bi-iliac atherosclerosis. Aortic tortuosity, no aneurysm. Patent portal vein. No suspicious abdominal adenopathy. No enlarged retroperitoneal nodes. Reproductive: Status post hysterectomy. No adnexal masses. Other: No ascites.  No abdominal wall hernia. Musculoskeletal: The bones are subjectively under mineralized. Moderate lumbar degenerative change. There are Tarvlov cysts in the sacrum. No suspicious bone lesion. IMPRESSION: 1. No acute abnormality. 2. Cholelithiasis without CT findings of acute cholecystitis. 3. Solid enhancing lesion in the anterior mid right kidney measuring 15 x 13 mm, unchanged in size from prior exam. This is suspicious for renal cell carcinoma. 4. Moderate colonic stool burden, can be seen with constipation. Aortic Atherosclerosis (ICD10-I70.0). Electronically Signed   By: Andrea Gasman M.D.   On: 03/29/2023 16:22    Assessment & Plan:   Problem List Items Addressed This Visit     Actinic keratoses   Large skin lesion on the left cheek.  Actinic keratosis, more likely a skin cancer.  The patient refused dermatology referral, excision.  She would like me to freeze it.  See picture.  See procedure See me back in a couple months if relapsed The small one is AK      Chronic thoracic back pain - Primary   Chronic moderate  thoracic spine back pain on the left since 12/19/23. Probable facet joint arthritis versus other.  History of compression fracture of T5.  The patient refused x-rays RTC 2 wks RTC 2 wks Pain is 5-6 out of 10 now.  Celebrex  100 mg  helps.  Continue with heat, yoga, massage etc. PM referral to see Dr.Shtridelman was offered       Relevant Medications   celecoxib  (CELEBREX ) 100 MG capsule      Meds ordered this encounter  Medications  celecoxib  (CELEBREX ) 100 MG capsule    Sig: Take 1 capsule (100 mg total) by mouth 2 (two) times daily.    Dispense:  60 capsule    Refill:  1      Follow-up: Return in about 3 months (around 09/03/2024) for a follow-up visit.  Marolyn Noel, MD

## 2024-06-14 DIAGNOSIS — H40053 Ocular hypertension, bilateral: Secondary | ICD-10-CM | POA: Diagnosis not present

## 2024-06-14 DIAGNOSIS — H353132 Nonexudative age-related macular degeneration, bilateral, intermediate dry stage: Secondary | ICD-10-CM | POA: Diagnosis not present

## 2024-06-14 DIAGNOSIS — H35343 Macular cyst, hole, or pseudohole, bilateral: Secondary | ICD-10-CM | POA: Diagnosis not present

## 2024-06-14 DIAGNOSIS — H40023 Open angle with borderline findings, high risk, bilateral: Secondary | ICD-10-CM | POA: Diagnosis not present

## 2024-06-14 LAB — OPHTHALMOLOGY REPORT-SCANNED

## 2024-07-15 DIAGNOSIS — H903 Sensorineural hearing loss, bilateral: Secondary | ICD-10-CM | POA: Diagnosis not present

## 2024-07-19 DIAGNOSIS — H40023 Open angle with borderline findings, high risk, bilateral: Secondary | ICD-10-CM | POA: Diagnosis not present

## 2024-07-19 DIAGNOSIS — H40053 Ocular hypertension, bilateral: Secondary | ICD-10-CM | POA: Diagnosis not present

## 2024-07-26 ENCOUNTER — Encounter (INDEPENDENT_AMBULATORY_CARE_PROVIDER_SITE_OTHER): Admitting: Ophthalmology

## 2024-07-26 DIAGNOSIS — H43813 Vitreous degeneration, bilateral: Secondary | ICD-10-CM | POA: Diagnosis not present

## 2024-07-26 DIAGNOSIS — H353132 Nonexudative age-related macular degeneration, bilateral, intermediate dry stage: Secondary | ICD-10-CM | POA: Diagnosis not present

## 2024-07-26 DIAGNOSIS — H59033 Cystoid macular edema following cataract surgery, bilateral: Secondary | ICD-10-CM | POA: Diagnosis not present

## 2024-07-28 DIAGNOSIS — H40023 Open angle with borderline findings, high risk, bilateral: Secondary | ICD-10-CM | POA: Diagnosis not present

## 2024-07-28 DIAGNOSIS — H40053 Ocular hypertension, bilateral: Secondary | ICD-10-CM | POA: Diagnosis not present

## 2024-08-10 ENCOUNTER — Other Ambulatory Visit: Payer: Self-pay | Admitting: Internal Medicine

## 2024-09-16 ENCOUNTER — Encounter: Payer: Self-pay | Admitting: Internal Medicine

## 2024-09-16 ENCOUNTER — Ambulatory Visit: Admitting: Internal Medicine

## 2024-09-16 VITALS — BP 144/60 | HR 77 | Ht 60.0 in | Wt 93.6 lb

## 2024-09-16 DIAGNOSIS — B029 Zoster without complications: Secondary | ICD-10-CM

## 2024-09-16 DIAGNOSIS — G8929 Other chronic pain: Secondary | ICD-10-CM

## 2024-09-16 DIAGNOSIS — M79671 Pain in right foot: Secondary | ICD-10-CM

## 2024-09-16 DIAGNOSIS — R0609 Other forms of dyspnea: Secondary | ICD-10-CM

## 2024-09-16 DIAGNOSIS — R413 Other amnesia: Secondary | ICD-10-CM

## 2024-09-16 LAB — COMPREHENSIVE METABOLIC PANEL WITH GFR
ALT: 15 U/L (ref 3–35)
AST: 17 U/L (ref 5–37)
Albumin: 4.1 g/dL (ref 3.5–5.2)
Alkaline Phosphatase: 58 U/L (ref 39–117)
BUN: 16 mg/dL (ref 6–23)
CO2: 28 meq/L (ref 19–32)
Calcium: 8.7 mg/dL (ref 8.4–10.5)
Chloride: 97 meq/L (ref 96–112)
Creatinine, Ser: 0.57 mg/dL (ref 0.40–1.20)
GFR: 80.17 mL/min
Glucose, Bld: 104 mg/dL — ABNORMAL HIGH (ref 70–99)
Potassium: 4.2 meq/L (ref 3.5–5.1)
Sodium: 130 meq/L — ABNORMAL LOW (ref 135–145)
Total Bilirubin: 0.5 mg/dL (ref 0.2–1.2)
Total Protein: 6.5 g/dL (ref 6.0–8.3)

## 2024-09-16 LAB — CBC WITH DIFFERENTIAL/PLATELET
Basophils Absolute: 0.1 10*3/uL (ref 0.0–0.1)
Basophils Relative: 0.8 % (ref 0.0–3.0)
Eosinophils Absolute: 0.1 10*3/uL (ref 0.0–0.7)
Eosinophils Relative: 1.1 % (ref 0.0–5.0)
HCT: 39.5 % (ref 36.0–46.0)
Hemoglobin: 13.2 g/dL (ref 12.0–15.0)
Lymphocytes Relative: 27.4 % (ref 12.0–46.0)
Lymphs Abs: 2.2 10*3/uL (ref 0.7–4.0)
MCHC: 33.4 g/dL (ref 30.0–36.0)
MCV: 90.5 fl (ref 78.0–100.0)
Monocytes Absolute: 0.6 10*3/uL (ref 0.1–1.0)
Monocytes Relative: 7.6 % (ref 3.0–12.0)
Neutro Abs: 5 10*3/uL (ref 1.4–7.7)
Neutrophils Relative %: 63.1 % (ref 43.0–77.0)
Platelets: 268 10*3/uL (ref 150.0–400.0)
RBC: 4.36 Mil/uL (ref 3.87–5.11)
RDW: 14.3 % (ref 11.5–15.5)
WBC: 8 10*3/uL (ref 4.0–10.5)

## 2024-09-16 LAB — T4, FREE: Free T4: 0.91 ng/dL (ref 0.60–1.60)

## 2024-09-16 LAB — BRAIN NATRIURETIC PEPTIDE: Pro B Natriuretic peptide (BNP): 1020 pg/mL — ABNORMAL HIGH (ref 1.0–100.0)

## 2024-09-16 LAB — TSH: TSH: 0.95 u[IU]/mL (ref 0.35–5.50)

## 2024-09-16 MED ORDER — CELECOXIB 100 MG PO CAPS: 100.0000 mg | ORAL_CAPSULE | Freq: Two times a day (BID) | ORAL | 0 refills | Status: AC

## 2024-09-16 MED ORDER — PANTOPRAZOLE SODIUM 40 MG PO TBEC: 40.0000 mg | DELAYED_RELEASE_TABLET | Freq: Every day | ORAL | 3 refills | Status: AC

## 2024-09-16 NOTE — Assessment & Plan Note (Signed)
 Chronic moderate thoracic spine back pain on the left since 12/19/23. Probable facet joint arthritis versus other.  History of compression fracture of T5.  The patient refused x-rays RTC 2 wks Pain is 5-6 out of 10 now.  Celebrex  100 mg  helps.  Continue with heat, yoga, massage etc. PM referral to see Dr.Shtridelman was offered

## 2024-09-16 NOTE — Assessment & Plan Note (Signed)
 Mild x 6 months No CP  CT ca scoring test option suggested

## 2024-09-16 NOTE — Assessment & Plan Note (Signed)
 Doing well

## 2024-09-16 NOTE — Progress Notes (Unsigned)
 "  Subjective:  Patient ID: Jessica Rowland, female    DOB: [birth year]  Age: 89 y.o. MRN: [id number]  CC: Back Pain (Left sided thoracic pain)   HPI Makeba Delcastillo presents for LBP C/o L ankle pain and swelling C/o DOE  Outpatient Medications Prior to Visit  Medication Sig Dispense Refill   brimonidine-timolol (COMBIGAN) 0.2-0.5 % ophthalmic solution Apply to eye.     Cholecalciferol (VITAMIN D3) 2000 units capsule Take 1 capsule (2,000 Units total) by mouth daily. 100 capsule 3   ketorolac  (ACULAR ) 0.5 % ophthalmic solution      MegaRed Omega-3 Krill Oil 500 MG CAPS Take 1 capsule by mouth every morning.     prednisoLONE acetate (PRED FORTE) 1 % ophthalmic suspension      sennosides-docusate sodium  (SENOKOT-S) 8.6-50 MG tablet Take 1 tablet by mouth daily. 90 tablet 1   celecoxib  (CELEBREX ) 100 MG capsule TAKE 1 CAPSULE BY MOUTH 2 TIMES A DAY 120 capsule 0   pantoprazole  (PROTONIX ) 40 MG tablet Take 1 tablet (40 mg total) by mouth daily. 90 tablet 3   No facility-administered medications prior to visit.    ROS: Review of Systems  Constitutional:  Negative for activity change, appetite change, chills, fatigue and unexpected weight change.  HENT:  Negative for congestion, mouth sores and sinus pressure.   Eyes:  Negative for visual disturbance.  Respiratory:  Negative for cough and chest tightness.   Gastrointestinal:  Negative for abdominal pain and nausea.  Genitourinary:  Negative for difficulty urinating, frequency and vaginal pain.  Musculoskeletal:  Positive for arthralgias, back pain and gait problem.  Skin:  Negative for pallor and rash.  Neurological:  Negative for dizziness, tremors, weakness, numbness and headaches.  Psychiatric/Behavioral:  Negative for confusion and sleep disturbance.     Objective:  BP (!) 144/60   Pulse 77   Ht 5' (1.524 m)   Wt 93 lb 9.6 oz (42.5 kg)   SpO2 96%   BMI 18.28 kg/m   BP Readings from Last 3 Encounters:  09/16/24  (!) 144/60  06/03/24 (!) 122/58  02/23/24 110/70    Wt Readings from Last 3 Encounters:  09/16/24 93 lb 9.6 oz (42.5 kg)  06/03/24 90 lb (40.8 kg)  02/23/24 90 lb (40.8 kg)    Physical Exam Constitutional:      General: She is not in acute distress.    Appearance: She is well-developed.  HENT:     Head: Normocephalic.     Right Ear: External ear normal.     Left Ear: External ear normal.     Nose: Nose normal.  Eyes:     General:        Right eye: No discharge.        Left eye: No discharge.     Conjunctiva/sclera: Conjunctivae normal.     Pupils: Pupils are equal, round, and reactive to light.  Neck:     Thyroid : No thyromegaly.     Vascular: No JVD.     Trachea: No tracheal deviation.  Cardiovascular:     Rate and Rhythm: Normal rate and regular rhythm.     Heart sounds: Normal heart sounds.  Pulmonary:     Effort: No respiratory distress.     Breath sounds: No stridor. No wheezing.  Abdominal:     General: Bowel sounds are normal. There is no distension.     Palpations: Abdomen is soft. There is no mass.     Tenderness: There is no abdominal tenderness.  There is no guarding or rebound.  Musculoskeletal:        General: No tenderness.     Cervical back: Normal range of motion and neck supple. No rigidity.  Lymphadenopathy:     Cervical: No cervical adenopathy.  Skin:    Findings: No erythema or rash.  Neurological:     Mental Status: She is oriented to person, place, and time.     Cranial Nerves: No cranial nerve deficit.     Motor: No abnormal muscle tone.     Coordination: Coordination normal.     Deep Tendon Reflexes: Reflexes normal.  Psychiatric:        Behavior: Behavior normal.        Thought Content: Thought content normal.        Judgment: Judgment normal.   Procedure: EKG Indication: DOE Impression: NSR w/1 st degree AV block.  PVCs. No acute changes.     Procedure Note :     Procedure : Cryosurgery   Indication:  Actinic  keratosis(es)   Risks including unsuccessful procedure , bleeding, infection, bruising, scar, a need for a repeat  procedure and others were explained to the patient in detail as well as the benefits. Informed consent was obtained verbally.   2  lesion(s)  on  L face  was/were treated with liquid nitrogen on a Q-tip in a usual fasion . Band-Aid was applied and antibiotic ointment was given for a later use.   Tolerated well. Complications none.   Postprocedure instructions :     Keep the wounds clean. You can wash them with liquid soap and water. Pat dry with gauze or a Kleenex tissue  Before applying antibiotic ointment and a Band-Aid.   You need to report immediately  if  any signs of infection develop.     Lab Results  Component Value Date   WBC 7.2 02/23/2024   HGB 13.0 02/23/2024   HCT 38.6 02/23/2024   PLT 284.0 02/23/2024   GLUCOSE 118 (H) 02/23/2024   CHOL 259 (H) 03/16/2021   TRIG 203.0 (H) 03/16/2021   HDL 72.50 03/16/2021   LDLDIRECT 158.0 03/16/2021   ALT 13 02/23/2024   AST 15 02/23/2024   NA 127 (L) 02/23/2024   K 4.3 02/23/2024   CL 94 (L) 02/23/2024   CREATININE 0.48 02/23/2024   BUN 15 02/23/2024   CO2 28 02/23/2024   TSH 1.62 10/14/2023    CT ABDOMEN PELVIS W CONTRAST Result Date: 03/29/2023 CLINICAL DATA:  Abdominal pain, right upper quadrant. Renal cancer, right. Calculus of gallbladder without cholecystitis without obstruction. EXAM: CT ABDOMEN AND PELVIS WITH CONTRAST TECHNIQUE: Multidetector CT imaging of the abdomen and pelvis was performed using the standard protocol following bolus administration of intravenous contrast. RADIATION DOSE REDUCTION: This exam was performed according to the departmental dose-optimization program which includes automated exposure control, adjustment of the mA and/or kV according to patient size and/or use of iterative reconstruction technique. CONTRAST:  75mL ISOVUE -300 IOPAMIDOL  (ISOVUE -300) INJECTION 61% COMPARISON:  CT  01/14/2022 FINDINGS: Lower chest: Mild cardiomegaly, chronic. No focal airspace disease or pleural effusion. Hepatobiliary: No focal hepatic abnormality. Small calcified gallstone in the gallbladder fundus. Gallbladder is physiologically distended. No pericholecystic inflammation or evidence of gallbladder wall thickening. Common bile duct measures 6 mm, normal. No visualized choledocholithiasis. Pancreas: No pancreatic inflammation or ductal dilatation. No solid pancreatic mass. Spleen: Normal in size without focal abnormality. Adrenals/Urinary Tract: Normal adrenal glands. Solid enhancing lesion in the anterior mid right kidney measures approximately  15 x 13 mm series 2, image 31. This is unchanged in size from prior exam. No involvement of the renal vascular structures. Several cortical cysts and low-density lesions too small to characterize, unchanged. No further follow-up imaging of the cysts is recommended. Symmetric prominence of both renal collecting systems, however no hydronephrosis. No ureteral dilatation. No renal calculi. Urinary bladder is moderately distended. No bladder wall thickening. Stomach/Bowel: The stomach is nondistended. There is no small bowel obstruction or inflammatory change. Administered enteric contrast reaches the ascending colon. No terminal ileal inflammation. Normal appendix. Moderate volume of stool throughout the entire colon. No colonic wall thickening, pericolonic edema or significant diverticular disease. No abnormal rectal distention. Vascular/Lymphatic: Aorto bi-iliac atherosclerosis. Aortic tortuosity, no aneurysm. Patent portal vein. No suspicious abdominal adenopathy. No enlarged retroperitoneal nodes. Reproductive: Status post hysterectomy. No adnexal masses. Other: No ascites.  No abdominal wall hernia. Musculoskeletal: The bones are subjectively under mineralized. Moderate lumbar degenerative change. There are Tarvlov cysts in the sacrum. No suspicious bone lesion.  IMPRESSION: 1. No acute abnormality. 2. Cholelithiasis without CT findings of acute cholecystitis. 3. Solid enhancing lesion in the anterior mid right kidney measuring 15 x 13 mm, unchanged in size from prior exam. This is suspicious for renal cell carcinoma. 4. Moderate colonic stool burden, can be seen with constipation. Aortic Atherosclerosis (ICD10-I70.0). Electronically Signed   By: Andrea Gasman M.D.   On: 03/29/2023 16:22    Assessment & Plan:   Problem List Items Addressed This Visit     DOE (dyspnea on exertion) - Primary   Mild x 6 months No CP  CT ca scoring test option suggested       Relevant Orders   EKG 12-Lead   CBC with Differential/Platelet   T4, free   TSH   Comprehensive metabolic panel with GFR   Brain natriuretic peptide   RESOLVED: Herpes zoster   Memory problem   Doing well      Chronic thoracic back pain   Chronic moderate thoracic spine back pain on the left since 12/19/23. Probable facet joint arthritis versus other.  History of compression fracture of T5.  The patient refused x-rays RTC 2 wks Pain is 5-6 out of 10 now.  Celebrex  100 mg  helps.  Continue with heat, yoga, massage etc. PM referral to see Dr.Shtridelman was offered       Relevant Medications   celecoxib  (CELEBREX ) 100 MG capsule   Other Relevant Orders   EKG 12-Lead   CBC with Differential/Platelet   T4, free   TSH   Comprehensive metabolic panel with GFR   Other Visit Diagnoses       Pain in both feet       Relevant Orders   Ambulatory referral to Podiatry         Meds ordered this encounter  Medications   celecoxib  (CELEBREX ) 100 MG capsule    Sig: Take 1 capsule (100 mg total) by mouth 2 (two) times daily.    Dispense:  120 capsule    Refill:  0   pantoprazole  (PROTONIX ) 40 MG tablet    Sig: Take 1 tablet (40 mg total) by mouth daily.    Dispense:  90 tablet    Refill:  3      Follow-up: Return in about 3 months (around 12/14/2024) for a follow-up  visit.  Marolyn Noel, MD "

## 2024-10-13 ENCOUNTER — Ambulatory Visit: Admitting: Podiatry

## 2024-10-25 ENCOUNTER — Encounter (INDEPENDENT_AMBULATORY_CARE_PROVIDER_SITE_OTHER): Admitting: Ophthalmology
